# Patient Record
Sex: Female | Born: 1970 | Race: White | Hispanic: No | Smoking: Current every day smoker
Health system: Southern US, Community
[De-identification: ages and names within clinical notes are randomized; demographics above are authoritative.]

## PROBLEM LIST (undated history)

## (undated) ENCOUNTER — Emergency Department (HOSPITAL_BASED_OUTPATIENT_CLINIC_OR_DEPARTMENT_OTHER): Admission: EM | Payer: 59 | Source: Home / Self Care

## (undated) DIAGNOSIS — F319 Bipolar disorder, unspecified: Secondary | ICD-10-CM

## (undated) DIAGNOSIS — F419 Anxiety disorder, unspecified: Secondary | ICD-10-CM

## (undated) HISTORY — PX: DILATION AND CURETTAGE OF UTERUS: SHX78

## (undated) HISTORY — PX: OTHER SURGICAL HISTORY: SHX169

## (undated) HISTORY — PX: TUBAL LIGATION: SHX77

## (undated) HISTORY — PX: NO PAST SURGERIES: SHX2092

---

## 2008-09-05 ENCOUNTER — Ambulatory Visit: Payer: Self-pay | Admitting: Family Medicine

## 2008-09-05 DIAGNOSIS — J209 Acute bronchitis, unspecified: Secondary | ICD-10-CM

## 2009-01-06 ENCOUNTER — Ambulatory Visit: Payer: Self-pay | Admitting: Family Medicine

## 2009-01-06 DIAGNOSIS — J019 Acute sinusitis, unspecified: Secondary | ICD-10-CM | POA: Insufficient documentation

## 2009-01-06 DIAGNOSIS — F411 Generalized anxiety disorder: Secondary | ICD-10-CM | POA: Insufficient documentation

## 2009-05-13 ENCOUNTER — Ambulatory Visit: Payer: Self-pay | Admitting: Family Medicine

## 2009-05-13 DIAGNOSIS — S335XXA Sprain of ligaments of lumbar spine, initial encounter: Secondary | ICD-10-CM

## 2011-09-23 LAB — HM PAP SMEAR: HM PAP: NEGATIVE

## 2016-02-03 LAB — VITAMIN D 25 HYDROXY (VIT D DEFICIENCY, FRACTURES)
Vit D, 25-Hydroxy: 19
Vit D, 25-Hydroxy: 19

## 2016-02-03 LAB — BASIC METABOLIC PANEL
BUN: 11 mg/dL (ref 4–21)
Creatinine: 0.7 mg/dL (ref 0.5–1.1)
GLUCOSE: 87 mg/dL
POTASSIUM: 4.3 mmol/L (ref 3.4–5.3)
Sodium: 139 mmol/L (ref 137–147)

## 2016-02-03 LAB — LIPID PANEL
CHOLESTEROL: 184 mg/dL (ref 0–200)
HDL: 46 mg/dL (ref 35–70)
LDL Cholesterol: 128 mg/dL
TRIGLYCERIDES: 121 mg/dL (ref 40–160)

## 2016-02-03 LAB — HEPATIC FUNCTION PANEL
ALT: 1 U/L — AB (ref 7–35)
AST: 10 U/L — AB (ref 13–35)
Alkaline Phosphatase: 70 U/L (ref 25–125)

## 2016-02-03 LAB — TSH: TSH: 0.84 u[IU]/mL (ref 0.41–5.90)

## 2016-02-26 LAB — HM MAMMOGRAPHY

## 2016-10-13 ENCOUNTER — Ambulatory Visit (INDEPENDENT_AMBULATORY_CARE_PROVIDER_SITE_OTHER): Payer: Self-pay | Admitting: Family Medicine

## 2016-10-13 ENCOUNTER — Encounter: Payer: Self-pay | Admitting: Family Medicine

## 2016-10-13 VITALS — BP 124/64 | HR 101 | Ht 64.0 in | Wt 213.0 lb

## 2016-10-13 DIAGNOSIS — G43109 Migraine with aura, not intractable, without status migrainosus: Secondary | ICD-10-CM

## 2016-10-13 DIAGNOSIS — F430 Acute stress reaction: Secondary | ICD-10-CM

## 2016-10-13 DIAGNOSIS — F32A Depression, unspecified: Secondary | ICD-10-CM | POA: Insufficient documentation

## 2016-10-13 DIAGNOSIS — F329 Major depressive disorder, single episode, unspecified: Secondary | ICD-10-CM | POA: Insufficient documentation

## 2016-10-13 DIAGNOSIS — R030 Elevated blood-pressure reading, without diagnosis of hypertension: Secondary | ICD-10-CM

## 2016-10-13 DIAGNOSIS — F411 Generalized anxiety disorder: Secondary | ICD-10-CM

## 2016-10-13 NOTE — Progress Notes (Signed)
Subjective:    Patient ID: Hailey Wolf, female    DOB: January 25, 1971, 46 y.o.   MRN: [id number]  HPI 46 yo Female comes in today to follow-up for recent hospital visit and to establish with a primary care provider. She does not currently have a PCP. She actually went to the hospital, [facility] in [place] on March 8. At the time she was experiencing some facial numbness on the left side of her face as well as over the lips and tongue she had been experiencing a migraine for well over a day at that point. She says it felt almost like when you get anesthesia for dental work. She has been on Celexa for about 12 years. She was sent home with Fioricet and told to follow-up with neurology. She did actually go see the neurologist this morning. They felt like her symptoms were related to a complicated migraine. She is still expressing headaches on and off as well as some left facial numbness sensation. They decided to put her on amitriptyline at bedtime and give her Imitrex for rescue. She plans on picking up his medications later today. She did have an MRI/MRA of the brain that showed some minimal tiny white matter lesions. They were nonspecific.  She admits she's been under a lot of stress at work recently. She has worked in her job at [employer] for well over 20 years and 2 of her friends and her department who she has worked with for more than 10 years had a major disagreement and she is no longer friends with them. She feels like this may have affected her work ability. She said she was written up for being late to work for her work, even though she says she has never been late to work. She says she was also written for missing days but all of those were approved previously.. They requested that she go home and see a therapist through [facility]. She said she actually really like the therapist and so plans on continuing to see her weekly. The therapist acid actually recommended that she seek psychiatry to  possibly look at changing her Celexa. She also takes care of her son who has autism which is also another stress in her life. She admits she has not been sleeping well   Hospital and neurology notes reviewed.  Review of Systems  Constitutional: Negative for diaphoresis, fever and unexpected weight change.  HENT: Negative for hearing loss, postnasal drip, sneezing and tinnitus.   Eyes: Positive for visual disturbance.  Respiratory: Negative for cough and wheezing.   Cardiovascular: Negative for chest pain and palpitations.  Gastrointestinal: Positive for diarrhea, nausea and vomiting.  Genitourinary: Negative for vaginal bleeding and vaginal discharge.  Musculoskeletal: Negative for arthralgias.  Neurological: Positive for headaches.  Hematological: Negative for adenopathy. Does not bruise/bleed easily.  Psychiatric/Behavioral: Positive for dysphoric mood. The patient is nervous/anxious.      BP 124/64   Pulse (!) 101   Ht 5\' 4"  (1.626 m)   Wt 213 lb (96.6 kg)   SpO2 97%   BMI 36.56 kg/m     Allergies  Allergen Reactions  . Codeine Nausea And Vomiting    REACTION: GI Upset    History reviewed. No pertinent past medical history.   Past Surgical History:  Procedure Laterality Date  . NO PAST SURGERIES      Social History   Social History  . Marital status: Single    Spouse name: N/A  . Number of  children: 2  . 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   Social History Main Topics  . Smoking status: Current Every Day Smoker    Packs/day: 0.50    Types: Cigarettes  . Smokeless tobacco: Not on file  . Alcohol use 0.6 - 1.2 oz/week    1 - 2 Glasses of wine per week  . Drug use: No  . Sexual activity: Yes    Partners: Male   Other Topics Concern  . Not on file   Social History Narrative   2 caffeinated drinks per day. No regular exercise.    Family History  Problem Relation Age of Onset  . Hypertension Mother   .  Alcohol abuse Father   . Hypertension Father   . Diabetes Paternal Grandmother   . Alcohol abuse Paternal Aunt   . Ovarian cancer Paternal Aunt     Outpatient Encounter Prescriptions as of 10/13/2016  Medication Sig  . amitriptyline (ELAVIL) 25 MG tablet Take by mouth.  . citalopram (CELEXA) 40 MG tablet   . clonazePAM (KLONOPIN) 1 MG tablet take 1 tablet by mouth twice a day if needed for anxiety  . magnesium oxide (MAG-OX) 400 MG tablet Take by mouth.  . metoCLOPramide (REGLAN) 10 MG tablet Take by mouth.  . SUMAtriptan (IMITREX) 50 MG tablet Take by mouth.   No facility-administered encounter medications on file as of 10/13/2016.           Objective:   Physical Exam  Constitutional: She is oriented to person, place, and time. She appears well-developed and well-nourished.  HENT:  Head: Normocephalic and atraumatic.  Cardiovascular: Normal rate, regular rhythm and normal heart sounds.   Pulmonary/Chest: Effort normal and breath sounds normal.  Neurological: She is alert and oriented to person, place, and time.  Skin: Skin is warm and dry.  Psychiatric: She has a normal mood and affect. Her behavior is normal.       Assessment & Plan:  Elevated blood pressure-blood pressure looks absolutely fantastic today and she's never had any prior history of blood pressure issues. I think it was just related to the pain during her ED visit from the migraines.  Anxiety-has an appointment with her therapist again on Tuesday and is planning on seeing a psychiatrist for medication adjustment. Will need to complete FMLA work for her to be out of work for at least the next week.   Migraine headaches-she's been started on amitriptyline nightly and when necessary Imitrex. Encouraged her to work on sleep quality and work on stress reduction. We discussed how there are many triggers for migraines and getting that under control is very important.She is stopping the floor set as recommended by  neurology.

## 2016-10-18 ENCOUNTER — Telehealth: Payer: Self-pay | Admitting: *Deleted

## 2016-10-18 NOTE — Telephone Encounter (Signed)
FMLA forms completed, copied, scanned and faxed, confirmation received.Loralee PacasBarkley, Carden Teel Clark Mills[place]etta

## 2016-10-19 ENCOUNTER — Encounter: Payer: Self-pay | Admitting: Family Medicine

## 2016-10-30 ENCOUNTER — Ambulatory Visit: Payer: Self-pay | Admitting: Family Medicine

## 2016-10-31 ENCOUNTER — Encounter: Payer: Self-pay | Admitting: Family Medicine

## 2016-11-01 ENCOUNTER — Ambulatory Visit: Payer: Self-pay | Admitting: Family Medicine

## 2016-11-02 ENCOUNTER — Ambulatory Visit (INDEPENDENT_AMBULATORY_CARE_PROVIDER_SITE_OTHER): Payer: BLUE CROSS/BLUE SHIELD | Admitting: Family Medicine

## 2016-11-02 ENCOUNTER — Encounter: Payer: Self-pay | Admitting: Family Medicine

## 2016-11-02 ENCOUNTER — Telehealth: Payer: Self-pay | Admitting: Family Medicine

## 2016-11-02 VITALS — BP 128/74 | HR 77 | Ht 64.0 in | Wt 216.0 lb

## 2016-11-02 DIAGNOSIS — F411 Generalized anxiety disorder: Secondary | ICD-10-CM | POA: Diagnosis not present

## 2016-11-02 DIAGNOSIS — G43109 Migraine with aura, not intractable, without status migrainosus: Secondary | ICD-10-CM | POA: Diagnosis not present

## 2016-11-02 DIAGNOSIS — F321 Major depressive disorder, single episode, moderate: Secondary | ICD-10-CM

## 2016-11-02 MED ORDER — PAROXETINE HCL 20 MG PO TABS: 20.0000 mg | ORAL_TABLET | Freq: Every day | ORAL | 0 refills | Status: DC

## 2016-11-02 MED ORDER — HYDROXYZINE HCL 50 MG PO TABS: 25.0000 mg | ORAL_TABLET | Freq: Two times a day (BID) | ORAL | 3 refills | Status: DC | PRN

## 2016-11-02 NOTE — Progress Notes (Signed)
   Subjective:    Patient ID: Hailey Wolf, female    DOB: 10-Mar-1971, 46 y.o.   MRN: [id number]  HPI 10 day follow-up for anxiety/depression-unfortunately patient was expressing some serious stressors at work. To the point where was starting to affect her function at work. She started seeing a therapist through [facility] and came in to establish care. We did complete FMLA paperwork for her to have some time off to readjust. She is now on Celexa 40 mg. She was also expressing a lot of physical symptoms including increased frequency and intensity of headaches as well as elevated blood pressures. We had written out of work 3/8-3/26.  Unfortunately she says she was compelled to sign a forced resignation. She did go ahead and sign it. She is still seeing a therapist. She said she couldn't go last week because she felt so down but she has an appointment next week. She actually really likes her and feels like she's been very helpful. She does feel like her family's been very supportive. He says she's tried to start exercising over the last 2 weeks.  She was also seen neurology for complicated migraine. She just seen them the same day that I saw her for mood. They decided to put her on amitriptyline at bedtime and give her Imitrex for rescue. Had a negative MRI except for some white matter changes.  Review of Systems     Objective:   Physical Exam  Constitutional: She is oriented to person, place, and time. She appears well-developed and well-nourished.  HENT:  Head: Normocephalic and atraumatic.  Cardiovascular: Normal rate, regular rhythm and normal heart sounds.   Pulmonary/Chest: Effort normal and breath sounds normal.  Neurological: She is alert and oriented to person, place, and time.  Skin: Skin is warm and dry.  Psychiatric: She has a normal mood and affect. Her behavior is normal.       Assessment & Plan:  Anxiety/Depression-PHQ 9 score of 7 today and GAD-  7 score of 26.Discussed  options. She said she has tried fluoxetine in the past and it caused palpitations as well as Zoloft which also causes increased heart rate. She said she did take Paxil years ago and did well on it. We will go ahead and transition her to Paxil. I'll see her back in about 4-5 weeks. Keep up the exercise.  Migraine HA - ON imitrex, daily amitriptyline and magnesium supplementation.  At the end of the office visit she did mention that the forced resignation was somehow related to a urine drug screen that was performed and wondered if they had performed 1 during her recent ED visit. I was not aware of 1 and encouraged her to call their medical records department to see if they could provide her with information and a copy if they did perform 1.  Time spent 25 minutes, greater than 50% time spent counseling about anxiety depression and migraines.

## 2016-11-02 NOTE — Patient Instructions (Addendum)
Take half a tab the citalopram daily for 7 days. Then half a tab every other day for 6 days and then start the paxil.  Follow instructions on the bottle.

## 2016-11-02 NOTE — Telephone Encounter (Signed)
Pt's mother Gunnar Fusi) called to let PCP know she is concerned about her daughters current state. Reports Azrael "just doesn't care." States she will say mean things, cuss, and not care if it hurts anyone's feelings. Pt's mother reports "this is not like her." She just wanted to give PCP a heads up. Would like Pt to not know this phone call was made.

## 2016-12-01 ENCOUNTER — Encounter: Payer: Self-pay | Admitting: Family Medicine

## 2016-12-01 ENCOUNTER — Ambulatory Visit (INDEPENDENT_AMBULATORY_CARE_PROVIDER_SITE_OTHER): Payer: BLUE CROSS/BLUE SHIELD | Admitting: Family Medicine

## 2016-12-01 VITALS — BP 139/77 | HR 104 | Ht 64.0 in | Wt 213.0 lb

## 2016-12-01 DIAGNOSIS — F411 Generalized anxiety disorder: Secondary | ICD-10-CM | POA: Diagnosis not present

## 2016-12-01 DIAGNOSIS — F321 Major depressive disorder, single episode, moderate: Secondary | ICD-10-CM

## 2016-12-01 DIAGNOSIS — F0781 Postconcussional syndrome: Secondary | ICD-10-CM

## 2016-12-01 DIAGNOSIS — T7491XA Unspecified adult maltreatment, confirmed, initial encounter: Secondary | ICD-10-CM

## 2016-12-01 DIAGNOSIS — R319 Hematuria, unspecified: Secondary | ICD-10-CM

## 2016-12-01 LAB — POCT URINALYSIS DIPSTICK
BILIRUBIN UA: NEGATIVE
Glucose, UA: NEGATIVE
KETONES UA: NEGATIVE
Leukocytes, UA: NEGATIVE
Nitrite, UA: NEGATIVE
PH UA: 5.5 (ref 5.0–8.0)
PROTEIN UA: NEGATIVE
SPEC GRAV UA: 1.02 (ref 1.010–1.025)
Urobilinogen, UA: 0.2 E.U./dL

## 2016-12-01 NOTE — Patient Instructions (Addendum)
Keep your appointment with neurology next week. Recommend either ibuprofen or Aleve as needed. Can alternate with extra strength  Tylenol if needed. Don't drive until your Neurologist says it is OK.    Your symptoms including more being more forgetful, increased confusion, persistent headaches, and irritability are all related to your concussion. This can take several weeks or even months sometimes to completely resolved. The most important thing is to rest. Avoid overstimulation such as TV or lots of screen time. And avoid excessive activities. Some light activities around the house is okay. If you notice increase in nausea or start vomiting them please let us know immediately.   Post-Concussion Syndrome Post-concussion syndrome describes the symptoms that can occur after a head injury. These symptoms can last from weeks to months. What are the causes? It is not clear why some head injuries cause post-concussion syndrome. It can occur whether your head injury was mild or severe and whether you were wearing head protection or not. What are the signs or symptoms?  Memory difficulties.  Dizziness.  Headaches.  Double vision or blurry vision.  Sensitivity to light.  Hearing difficulties.  Depression.  Tiredness.  Weakness.  Difficulty with concentration.  Difficulty sleeping or staying asleep.  Vomiting.  Poor balance or instability on your feet.  Slow reaction time.  Difficulty learning and remembering things you have heard. How is this diagnosed? There is no test to determine whether you have post-concussion syndrome. Your health care provider may order an imaging scan of your brain, such as a CT scan, to check for other problems that may be causing your symptoms (such as a severe injury inside your skull). How is this treated? Usually, these problems disappear over time without medical care. Your health care provider may prescribe medicine to help ease your symptoms. It  is important to follow up with a neurologist to evaluate your recovery and address any lingering symptoms or issues. Follow these instructions at home:  Take medicines only as directed by your health care provider. Do not take aspirin. Aspirin can slow blood clotting.  Sleep with your head slightly elevated to help with headaches.  Avoid any situation where there is potential for another head injury. This includes football, hockey, soccer, basketball, martial arts, downhill snow sports, and horseback riding. Your condition will get worse every time you experience a concussion. You should avoid these activities until you are evaluated by the appropriate follow-up health care providers.  Keep all follow-up visits as directed by your health care provider. This is important. Contact a health care provider if:  You have increased problems paying attention or concentrating.  You have increased difficulty remembering or learning new information.  You need more time to complete tasks or assignments than before.  You have increased irritability or decreased ability to cope with stress.  You have more symptoms than before. Seek medical care if you have any of the following symptoms for more than two weeks after your injury:  Lasting (chronic) headaches.  Dizziness or balance problems.  Nausea.  Vision problems.  Increased sensitivity to noise or light.  Depression or mood swings.  Anxiety or irritability.  Memory problems.  Difficulty concentrating or paying attention.  Sleep problems.  Feeling tired all the time. Get help right away if:  You have confusion or unusual drowsiness.  Others find it difficult to wake you up.  You have nausea or persistent, forceful vomiting.  You feel like you are moving when you are not (vertigo). Your  eyes may move rapidly back and forth.  You have convulsions or faint.  You have severe, persistent headaches that are not relieved by  medicine.  You cannot use your arms or legs normally.  One of your pupils is larger than the other.  You have clear or bloody discharge from your nose or ears.  Your problems are getting worse, not better. This information is not intended to replace advice given to you by your health care provider. Make sure you discuss any questions you have with your health care provider. Document Released: 01/06/2002 Document Revised: 02/04/2016 Document Reviewed: 10/22/2013 Elsevier Interactive Patient Education  2017 [employer].
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
## 2016-12-02 LAB — URINALYSIS, MICROSCOPIC ONLY
BACTERIA UA: NONE SEEN [HPF]
CASTS: NONE SEEN [LPF]
Squamous Epithelial / HPF: NONE SEEN [HPF] (ref <=5)
WBC, UA: NONE SEEN WBC/HPF (ref <=5)
YEAST: NONE SEEN [HPF]

## 2016-12-03 LAB — URINE CULTURE

## 2016-12-18 ENCOUNTER — Ambulatory Visit: Payer: BLUE CROSS/BLUE SHIELD | Admitting: Family Medicine

## 2017-05-13 ENCOUNTER — Encounter (HOSPITAL_COMMUNITY): Payer: Self-pay | Admitting: *Deleted

## 2017-05-13 ENCOUNTER — Emergency Department (HOSPITAL_COMMUNITY): Payer: Medicaid Other

## 2017-05-13 ENCOUNTER — Emergency Department (HOSPITAL_COMMUNITY)
Admission: EM | Admit: 2017-05-13 | Discharge: 2017-05-14 | Disposition: A | Discharge status: Other Acute Inpatient Hospital | Payer: Medicaid Other | Attending: Emergency Medicine | Admitting: Emergency Medicine

## 2017-05-13 ENCOUNTER — Ambulatory Visit (HOSPITAL_COMMUNITY)
Admission: RE | Admit: 2017-05-13 | Discharge: 2017-05-13 | Disposition: A | Discharge status: Home / Self Care | Payer: Medicaid Other | Attending: Psychiatry | Admitting: Psychiatry

## 2017-05-13 DIAGNOSIS — F17210 Nicotine dependence, cigarettes, uncomplicated: Secondary | ICD-10-CM | POA: Insufficient documentation

## 2017-05-13 DIAGNOSIS — R45851 Suicidal ideations: Secondary | ICD-10-CM | POA: Diagnosis not present

## 2017-05-13 DIAGNOSIS — F329 Major depressive disorder, single episode, unspecified: Secondary | ICD-10-CM | POA: Insufficient documentation

## 2017-05-13 DIAGNOSIS — Z79899 Other long term (current) drug therapy: Secondary | ICD-10-CM | POA: Insufficient documentation

## 2017-05-13 DIAGNOSIS — F419 Anxiety disorder, unspecified: Secondary | ICD-10-CM | POA: Diagnosis not present

## 2017-05-13 HISTORY — DX: Anxiety disorder, unspecified: F41.9

## 2017-05-13 LAB — RAPID URINE DRUG SCREEN, HOSP PERFORMED
Amphetamines: NOT DETECTED
BARBITURATES: NOT DETECTED
Benzodiazepines: POSITIVE — AB
COCAINE: NOT DETECTED
Opiates: NOT DETECTED
TETRAHYDROCANNABINOL: NOT DETECTED

## 2017-05-13 LAB — CBC
HEMATOCRIT: 37.7 % (ref 36.0–46.0)
HEMOGLOBIN: 13 g/dL (ref 12.0–15.0)
MCH: 26.5 pg (ref 26.0–34.0)
MCHC: 34.5 g/dL (ref 30.0–36.0)
MCV: 76.9 fL — ABNORMAL LOW (ref 78.0–100.0)
Platelets: 241 10*3/uL (ref 150–400)
RBC: 4.9 MIL/uL (ref 3.87–5.11)
RDW: 13.9 % (ref 11.5–15.5)
WBC: 7.3 10*3/uL (ref 4.0–10.5)

## 2017-05-13 LAB — COMPREHENSIVE METABOLIC PANEL
ALBUMIN: 4 g/dL (ref 3.5–5.0)
ALK PHOS: 69 U/L (ref 38–126)
ALT: 8 U/L — AB (ref 14–54)
AST: 11 U/L — ABNORMAL LOW (ref 15–41)
Anion gap: 8 (ref 5–15)
BUN: 9 mg/dL (ref 6–20)
CALCIUM: 9.5 mg/dL (ref 8.9–10.3)
CO2: 25 mmol/L (ref 22–32)
CREATININE: 0.69 mg/dL (ref 0.44–1.00)
Chloride: 102 mmol/L (ref 101–111)
GFR calc Af Amer: >60 mL/min (ref >60)
GFR calc non Af Amer: >60 mL/min (ref >60)
GLUCOSE: 88 mg/dL (ref 65–99)
Potassium: 4 mmol/L (ref 3.5–5.1)
SODIUM: 135 mmol/L (ref 135–145)
Total Bilirubin: 0.7 mg/dL (ref 0.3–1.2)
Total Protein: 7.3 g/dL (ref 6.5–8.1)

## 2017-05-13 LAB — I-STAT TROPONIN, ED: Troponin i, poc: 0 ng/mL (ref 0.00–0.08)

## 2017-05-13 LAB — I-STAT BETA HCG BLOOD, ED (MC, WL, AP ONLY)

## 2017-05-13 LAB — ETHANOL: Alcohol, Ethyl (B): <10 mg/dL (ref <10)

## 2017-05-13 LAB — SALICYLATE LEVEL: Salicylate Lvl: <7 mg/dL (ref 2.8–30.0)

## 2017-05-13 LAB — ACETAMINOPHEN LEVEL

## 2017-05-13 MED ORDER — ESCITALOPRAM OXALATE 10 MG PO TABS: 10.0000 mg | ORAL_TABLET | Freq: Every day | ORAL | Status: DC
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
## 2017-05-13 NOTE — H&P (Signed)
Behavioral Health Medical Screening Exam  Hailey Wolf is an 46 y.o. female.  Total Time spent with patient: 30 minutes  Psychiatric Specialty Exam: Physical Exam  Vitals reviewed. Constitutional: She is oriented to person, place, and time. She appears well-developed and well-nourished.  HENT:  Head: Normocephalic and atraumatic.  Eyes: Pupils are equal, round, and reactive to light.  Neck: Normal range of motion.  Cardiovascular: Normal rate, regular rhythm and normal heart sounds.   Respiratory: Effort normal and breath sounds normal.  GI: Soft. Bowel sounds are normal.  Musculoskeletal: Normal range of motion.  Neurological: She is alert and oriented to person, place, and time.  Skin: Skin is warm and dry.    Review of Systems  Psychiatric/Behavioral: Positive for depression and suicidal ideas (passive). Negative for hallucinations, memory loss and substance abuse. The patient is nervous/anxious and has insomnia.   All other systems reviewed and are negative.   Blood pressure 120/84, pulse 73, temperature 98.6 F (37 C), temperature source Oral, resp. rate 16, SpO2 97 %.There is no height or weight on file to calculate BMI.  General Appearance: Casual  Eye Contact:  Good  Speech:  Clear and Coherent and Normal Rate  Volume:  Normal  Mood:  Anxious and Depressed  Affect:  Congruent, Depressed and Tearful  Thought Process:  Coherent and Goal Directed  Orientation:  Full (Time, Place, and Person)  Thought Content:  WDL and Logical  Suicidal Thoughts:  passive ideation of wishing she's better off dead  Homicidal Thoughts:  No  Memory:  Immediate;   Good Recent;   Good Remote;   Fair  Judgement:  Intact  Insight:  Present  Psychomotor Activity:  Normal  Concentration: Concentration: Good and Attention Span: Good  Recall:  Good  Fund of Knowledge:Good  Language: Good  Akathisia:  Negative  Handed:  Right  AIMS (if indicated):     Assets:  Communication  Skills Desire for Improvement Resilience Social Support  Sleep:       Musculoskeletal: Strength & Muscle Tone: within normal limits Gait & Station: normal Patient leans: N/A  Blood pressure 120/84, pulse 73, temperature 98.6 F (37 C), temperature source Oral, resp. rate 16, SpO2 97 %.  Recommendations:  Based on my evaluation the patient appears to have an emergency medical condition for which I recommend the patient be transferred to the emergency department for further evaluation.  Delila Pereyra, NP 05/13/2017, 4:23 PM

## 2017-05-13 NOTE — ED Triage Notes (Signed)
Pt reports she was sent here from [facility] for medical clearance.  Pt reports severe anxiety x 2 weeks with SI, but denies plan.  Pt reports her PCP placed on Lexapro  without relief.  Pt reports she is feeling overwhelmed and is making her anxiety worse.  Pt is calm and cooperative.  She called mobil crisis and was advised to go to [facility].  Pt reports cp which she states happens every time she has an anxiety attack.

## 2017-05-13 NOTE — ED Notes (Signed)
Patient transported to X-ray 
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
## 2017-05-13 NOTE — BH Assessment (Signed)
[facility] Assessment Progress Note  Pt's referral has been faxed to the following inpt facilities for possible admission:  [facility], [place], [place], [address] [place], [place], [place] [facility], [place].  Princess Bruins, MSW, LCSW Therapeutic Triage Specialist  [phone]

## 2017-05-14 MED ORDER — IBUPROFEN 800 MG PO TABS
800.0000 mg | ORAL_TABLET | Freq: Once | ORAL | Status: AC
  Administered 2017-05-14: 800 mg via ORAL
  Filled 2017-05-14: qty 1

## 2017-05-14 NOTE — Progress Notes (Signed)
TTS received a call from [place] at [facility] who reports the pt has been accepted for admission. Accepting is Dr. [place] Spellman, MD. Pt to arrive after 07:00 on 05/14/17. Call to report [phone]. Pt's nurse Trisha Mangle, RN notified of acceptance.   Princess Bruins, MSW, LCSW Therapeutic Triage Specialist  [phone]

## 2017-05-14 NOTE — ED Notes (Signed)
Pt. On paper scrubs, wanded by security ,personal belongings kept in locker,labeled .sitetr at bedside.

## 2017-05-31 ENCOUNTER — Ambulatory Visit (INDEPENDENT_AMBULATORY_CARE_PROVIDER_SITE_OTHER): Payer: Medicaid Other | Admitting: Psychiatry

## 2017-05-31 ENCOUNTER — Encounter (HOSPITAL_COMMUNITY): Payer: Self-pay | Admitting: Psychiatry

## 2017-05-31 VITALS — BP 126/80 | HR 71 | Resp 16 | Ht 63.2 in | Wt 166.0 lb

## 2017-05-31 DIAGNOSIS — F332 Major depressive disorder, recurrent severe without psychotic features: Secondary | ICD-10-CM | POA: Diagnosis not present

## 2017-05-31 DIAGNOSIS — Z818 Family history of other mental and behavioral disorders: Secondary | ICD-10-CM | POA: Diagnosis not present

## 2017-05-31 DIAGNOSIS — Z811 Family history of alcohol abuse and dependence: Secondary | ICD-10-CM

## 2017-05-31 DIAGNOSIS — F411 Generalized anxiety disorder: Secondary | ICD-10-CM

## 2017-05-31 DIAGNOSIS — F331 Major depressive disorder, recurrent, moderate: Secondary | ICD-10-CM

## 2017-05-31 DIAGNOSIS — F17210 Nicotine dependence, cigarettes, uncomplicated: Secondary | ICD-10-CM | POA: Diagnosis not present

## 2017-05-31 DIAGNOSIS — G4700 Insomnia, unspecified: Secondary | ICD-10-CM

## 2017-05-31 DIAGNOSIS — Z79899 Other long term (current) drug therapy: Secondary | ICD-10-CM | POA: Diagnosis not present

## 2017-05-31 MED ORDER — TRAZODONE HCL 100 MG PO TABS: 200.0000 mg | ORAL_TABLET | Freq: Every day | ORAL | 0 refills | Status: DC

## 2017-05-31 MED ORDER — ESCITALOPRAM OXALATE 20 MG PO TABS: 30.0000 mg | ORAL_TABLET | Freq: Every day | ORAL | 0 refills | Status: DC

## 2017-05-31 MED ORDER — BUSPIRONE HCL 7.5 MG PO TABS: 7.5000 mg | ORAL_TABLET | Freq: Every day | ORAL | 0 refills | Status: DC | PRN
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
## 2017-06-07 ENCOUNTER — Ambulatory Visit (HOSPITAL_COMMUNITY): Payer: BLUE CROSS/BLUE SHIELD | Admitting: Psychiatry

## 2017-06-14 ENCOUNTER — Ambulatory Visit (INDEPENDENT_AMBULATORY_CARE_PROVIDER_SITE_OTHER): Payer: Medicaid Other | Admitting: Licensed Clinical Social Worker

## 2017-06-14 DIAGNOSIS — F411 Generalized anxiety disorder: Secondary | ICD-10-CM

## 2017-06-14 DIAGNOSIS — F331 Major depressive disorder, recurrent, moderate: Secondary | ICD-10-CM
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
## 2017-06-18 ENCOUNTER — Ambulatory Visit (INDEPENDENT_AMBULATORY_CARE_PROVIDER_SITE_OTHER): Payer: Medicaid Other | Admitting: Psychiatry

## 2017-06-18 ENCOUNTER — Other Ambulatory Visit: Payer: Self-pay

## 2017-06-18 ENCOUNTER — Encounter (HOSPITAL_COMMUNITY): Payer: Self-pay | Admitting: Psychiatry

## 2017-06-18 ENCOUNTER — Ambulatory Visit (HOSPITAL_COMMUNITY): Payer: Self-pay | Admitting: Psychiatry

## 2017-06-18 VITALS — BP 118/70 | HR 76 | Ht 62.25 in | Wt 170.0 lb

## 2017-06-18 DIAGNOSIS — F411 Generalized anxiety disorder: Secondary | ICD-10-CM | POA: Diagnosis not present

## 2017-06-18 DIAGNOSIS — F331 Major depressive disorder, recurrent, moderate: Secondary | ICD-10-CM | POA: Diagnosis not present

## 2017-06-18 MED ORDER — ESCITALOPRAM OXALATE 20 MG PO TABS: 30.0000 mg | ORAL_TABLET | Freq: Every day | ORAL | 1 refills | Status: DC

## 2017-06-18 MED ORDER — TRAZODONE HCL 100 MG PO TABS: 100.0000 mg | ORAL_TABLET | Freq: Every day | ORAL | 1 refills | Status: DC

## 2017-06-18 MED ORDER — BUSPIRONE HCL 7.5 MG PO TABS: 7.5000 mg | ORAL_TABLET | Freq: Every day | ORAL | 1 refills | Status: DC | PRN
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
## 2017-07-10 ENCOUNTER — Ambulatory Visit (HOSPITAL_COMMUNITY): Payer: Self-pay | Admitting: Licensed Clinical Social Worker

## 2017-07-18 ENCOUNTER — Ambulatory Visit (INDEPENDENT_AMBULATORY_CARE_PROVIDER_SITE_OTHER): Payer: Medicaid Other | Admitting: Licensed Clinical Social Worker

## 2017-07-18 DIAGNOSIS — F331 Major depressive disorder, recurrent, moderate: Secondary | ICD-10-CM | POA: Diagnosis not present

## 2017-07-18 DIAGNOSIS — F411 Generalized anxiety disorder: Secondary | ICD-10-CM
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
## 2017-08-06 ENCOUNTER — Ambulatory Visit (HOSPITAL_COMMUNITY): Payer: Self-pay | Admitting: Psychiatry

## 2017-08-16 ENCOUNTER — Encounter (HOSPITAL_COMMUNITY): Payer: Self-pay | Admitting: Psychiatry

## 2017-08-16 ENCOUNTER — Ambulatory Visit (INDEPENDENT_AMBULATORY_CARE_PROVIDER_SITE_OTHER): Payer: Medicaid Other | Admitting: Psychiatry

## 2017-08-16 ENCOUNTER — Other Ambulatory Visit: Payer: Self-pay

## 2017-08-16 VITALS — BP 110/60 | HR 78 | Ht 64.0 in | Wt 179.0 lb

## 2017-08-16 DIAGNOSIS — F411 Generalized anxiety disorder: Secondary | ICD-10-CM | POA: Diagnosis not present

## 2017-08-16 DIAGNOSIS — F331 Major depressive disorder, recurrent, moderate: Secondary | ICD-10-CM

## 2017-08-16 DIAGNOSIS — Z818 Family history of other mental and behavioral disorders: Secondary | ICD-10-CM | POA: Diagnosis not present

## 2017-08-16 DIAGNOSIS — G4700 Insomnia, unspecified: Secondary | ICD-10-CM

## 2017-08-16 DIAGNOSIS — Z79899 Other long term (current) drug therapy: Secondary | ICD-10-CM

## 2017-08-16 DIAGNOSIS — Z811 Family history of alcohol abuse and dependence: Secondary | ICD-10-CM | POA: Diagnosis not present

## 2017-08-16 DIAGNOSIS — F332 Major depressive disorder, recurrent severe without psychotic features: Secondary | ICD-10-CM

## 2017-08-16 DIAGNOSIS — Z87891 Personal history of nicotine dependence: Secondary | ICD-10-CM | POA: Diagnosis not present

## 2017-08-16 MED ORDER — BUSPIRONE HCL 7.5 MG PO TABS: 7.5000 mg | ORAL_TABLET | Freq: Two times a day (BID) | ORAL | 1 refills | Status: DC

## 2017-08-16 MED ORDER — TRAZODONE HCL 100 MG PO TABS: 100.0000 mg | ORAL_TABLET | Freq: Every day | ORAL | 1 refills | Status: DC

## 2017-08-16 MED ORDER — ESCITALOPRAM OXALATE 20 MG PO TABS: 30.0000 mg | ORAL_TABLET | Freq: Every day | ORAL | 1 refills | Status: DC
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
## 2017-08-24 ENCOUNTER — Ambulatory Visit (HOSPITAL_COMMUNITY): Payer: Self-pay | Admitting: Licensed Clinical Social Worker

## 2017-08-29 ENCOUNTER — Ambulatory Visit (INDEPENDENT_AMBULATORY_CARE_PROVIDER_SITE_OTHER): Payer: Medicaid Other | Admitting: Licensed Clinical Social Worker

## 2017-08-29 DIAGNOSIS — F411 Generalized anxiety disorder: Secondary | ICD-10-CM | POA: Diagnosis not present

## 2017-08-29 DIAGNOSIS — F331 Major depressive disorder, recurrent, moderate: Secondary | ICD-10-CM
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
## 2017-09-24 ENCOUNTER — Encounter (HOSPITAL_COMMUNITY): Payer: Self-pay | Admitting: Psychiatry

## 2017-09-24 ENCOUNTER — Other Ambulatory Visit: Payer: Self-pay

## 2017-09-24 ENCOUNTER — Ambulatory Visit (INDEPENDENT_AMBULATORY_CARE_PROVIDER_SITE_OTHER): Payer: Medicaid Other | Admitting: Psychiatry

## 2017-09-24 VITALS — BP 110/86 | HR 90 | Ht 64.0 in | Wt 175.0 lb

## 2017-09-24 DIAGNOSIS — F411 Generalized anxiety disorder: Secondary | ICD-10-CM | POA: Diagnosis not present

## 2017-09-24 DIAGNOSIS — Z811 Family history of alcohol abuse and dependence: Secondary | ICD-10-CM | POA: Diagnosis not present

## 2017-09-24 DIAGNOSIS — F17211 Nicotine dependence, cigarettes, in remission: Secondary | ICD-10-CM

## 2017-09-24 DIAGNOSIS — Z79899 Other long term (current) drug therapy: Secondary | ICD-10-CM | POA: Diagnosis not present

## 2017-09-24 DIAGNOSIS — F331 Major depressive disorder, recurrent, moderate: Secondary | ICD-10-CM | POA: Diagnosis not present

## 2017-09-24 MED ORDER — CITALOPRAM HYDROBROMIDE 40 MG PO TABS: 40.0000 mg | ORAL_TABLET | Freq: Every day | ORAL | 0 refills | Status: DC

## 2017-09-24 MED ORDER — TRAZODONE HCL 100 MG PO TABS: 100.0000 mg | ORAL_TABLET | Freq: Every day | ORAL | 1 refills | Status: DC

## 2017-09-24 MED ORDER — HYDROXYZINE HCL 10 MG PO TABS: 10.0000 mg | ORAL_TABLET | Freq: Three times a day (TID) | ORAL | 0 refills | Status: DC | PRN

## 2017-09-24 NOTE — Progress Notes (Signed)
[facility]   Patient Identification: Hailey Wolf MRN:  [id number] Date of Evaluation:  09/24/2017 Referral Source: Primary care Konrad PentaShuanna Garret  Chief Complaint:   Chief Complaint    Follow-up; Other     Visit Diagnosis:    ICD-10-CM   1. Moderate episode of recurrent major depressive disorder (HCC) F33.1   2. GAD (generalized anxiety disorder) F41.1     History of Present Illness:  47 years old divorced female, living with mom.  Recently admitted to hospital in [place] due to severe depression and suicidal thougths  Is here with mom. Feeling subdued. Failure. Kids under custody of her mom Dad is sick which is adding to her stress  Says celexa has helped before.   Feels buspar not helping anxiety  Aggravating factor: finances, lost job before  No psychosis, but feels people talk about he rwhen depressed Modifying factors: mom, kids  anxiety ongoing No psychosis   Past Psychiatric History: depression  Previous Psychotropic Medications: Yes   Substance Abuse History in the last 12 months:  No.  Consequences of Substance Abuse: NA  Past Medical History:  Past Medical History:  Diagnosis Date  . Anxiety     Past Surgical History:  Procedure Laterality Date  . CESAREAN SECTION    . DILATION AND CURETTAGE OF UTERUS    . lumpectormy    . NO PAST SURGERIES    . TUBAL LIGATION      Family Psychiatric History: mom and dad: depression  Family History:  Family History  Problem Relation Age of Onset  . Hypertension Mother   . Alcohol abuse Father   . Hypertension Father   . Diabetes Paternal Grandmother   . Alcohol abuse Paternal Aunt   . Ovarian cancer Paternal Aunt     Social History:   Social History   Socioeconomic History  . Marital status: Divorced    Spouse name: None  . Number of children: 2  . Years of education: assoc  . Highest education level: None  Social Needs  . Financial resource strain: None  . Food  insecurity - worry: None  . Food insecurity - inability: None  . Transportation needs - medical: None  . Transportation needs - non-medical: None  Occupational History  . Occupation: Diplomatic Services operational officerecretary     Comment: [employer]  Tobacco Use  . Smoking status: Former Smoker    Packs/day: 0.50    Types: Cigarettes    Last attempt to quit: 05/19/2017    Years since quitting: 0.3  . Smokeless tobacco: Never Used  . Tobacco comment: last cig 05/11/17  Substance and Sexual Activity  . Alcohol use: Yes    Alcohol/week: 0.6 - 1.2 oz    Types: 1 - 2 Glasses of wine per week    Comment: rare   . Drug use: No  . Sexual activity: Yes    Partners: Male  Other Topics Concern  . None  Social History Narrative   2 caffeinated drinks per day. No regular exercise.Lives with her fiance.       Allergies:   Allergies  Allergen Reactions  . Codeine Nausea And Vomiting    REACTION: GI Upset  . Fluoxetine Other (See Comments)    Palpitations  . Zoloft [Sertraline Hcl] Other (See Comments)    Palpitations    Metabolic Disorder Labs: No results found for: HGBA1C, MPG No results found for: PROLACTIN Lab Results  Component Value Date   CHOL 184 02/03/2016  TRIG 121 02/03/2016   HDL 46 02/03/2016   LDLCALC 128 02/03/2016     Current Medications: Current Outpatient Medications  Medication Sig Dispense Refill  . busPIRone (BUSPAR) 7.5 MG tablet Take 1 tablet (7.5 mg total) by mouth 2 (two) times daily. 60 tablet 1  . traZODone (DESYREL) 100 MG tablet Take 1 tablet (100 mg total) by mouth at bedtime. 30 tablet 1  . citalopram (CELEXA) 40 MG tablet Take 1 tablet (40 mg total) by mouth daily. 30 tablet 0  . hydrOXYzine (ATARAX/VISTARIL) 10 MG tablet Take 1 tablet (10 mg total) by mouth 3 (three) times daily as needed. 30 tablet 0   No current facility-administered medications for this visit.       Psychiatric Specialty Exam: Review of Systems  Cardiovascular: Negative for palpitations.   Skin: Negative for rash.  Neurological: Negative for tremors.  Psychiatric/Behavioral: Positive for depression. Negative for substance abuse. The patient is nervous/anxious.     Blood pressure 110/86, pulse 90, height 5\' 4"  (1.626 m), weight 175 lb (79.4 kg).Body mass index is 30.04 kg/m.  General Appearance: Casual  Eye Contact:  Fair  Speech:  Slow  Volume:  Decreased  Mood:  Stressed and dysphoric  Affect:  constricted  Thought Process:  Goal Directed  Orientation:  Full (Time, Place, and Person)  Thought Content:  Rumination  Suicidal Thoughts:  No  Homicidal Thoughts:  No  Memory:  Immediate;   Fair Recent;   Fair  Judgement:  Poor  Insight:  Shallow  Psychomotor Activity:  Decreased  Concentration:  Concentration: Fair and Attention Span: Fair  Recall:  Poor  Fund of Knowledge:Fair  Language: Fair  Akathisia:  Negative  Handed:  Right  AIMS (if indicated):  0  Assets:  Desire for Improvement  ADL's:  Intact  Cognition: WNL  Sleep:  variable    Treatment Plan Summaras followsMedication management and Plan as follows   1. Major depression severe, recurrent: dysphoric, stop lexapro, start celexa 40mg .    2. GAD: remains stressed. Work in therapy. Start celexa. Add vistaril 10mg  bid prn  stop buspar  3. Insomnia: irregular sleep. Continue trazadone. Work on sleep hygiene   Fu 6 w. Mom agrees to plan. Assign a Worry time and distract from negative thoughs after that time daily  Thresa Ross, MD 2/25/201910:53 AM

## 2017-09-26 ENCOUNTER — Ambulatory Visit (INDEPENDENT_AMBULATORY_CARE_PROVIDER_SITE_OTHER): Payer: Medicaid Other | Admitting: Licensed Clinical Social Worker

## 2017-09-26 DIAGNOSIS — F411 Generalized anxiety disorder: Secondary | ICD-10-CM

## 2017-09-26 DIAGNOSIS — F331 Major depressive disorder, recurrent, moderate: Secondary | ICD-10-CM | POA: Diagnosis not present

## 2017-09-26 NOTE — Progress Notes (Signed)
THERAPIST PROGRESS NOTE  Session Time: 11:06 AM to 12:02 PM  Participation Level: Active  Behavioral Response: CasualAlertDysphoric, tearful at times in session  Type of Therapy: Individual Therapy  Treatment Goals addressed:  reduce overall level, frequency and intensity of the anxiety so that daily functioning is not impaired, work on coping skills, increase in self-esteem and decrease of trauma symptoms  Interventions: CBT, Solution Focused, Strength-based, Supportive and Reframing  Summary: Hailey Wolf is a 47 y.o. female who presents with emotionally and physically exhausted after spending a couple of hours with her dad. Right now she is taking care of him and she is the only one to do that as she is the only child. Describes lots of anxiety taking care of him, explored source of anxiety and patient unsure, possibly, being at house. She hasn't stayed at his house too much over the years. Therapist suggested that perhaps she has not recovered from dealing with her own issues and difficult now to step in and help dad. Patient is trying to cut back time with him to help her cope. Shares in general the family has dealt with a lot of trauma in the past 5 years, lots of losses in the family. Shared more about her experiences with dad, that she gets sucked down, he talks a lot, talks a lot about his health issues. He also talks about her getting a job and he does not understand the difficulties of that. Updated therapist on medications, and that doctor has prescribed a med to deal with situational anxiety that she also has to figure out ways to calm down. Patient describes anxiety all day, has panic attacks and describes that she is going to pass out, feels like going to vomit, tries to talk herself down, describes the hyperarousal state is something she feels all day, describes it as physically exhausting. Describes feelings of guilt related to how she feels taking care of dad, thinks she  doesn't know right to feel the way she does. Therapist challenged patient on being judgmental to self, to recognize feelings as her feeling so valid. Patient describes depression, does not feel hopeful about the future, and use reframing to help patient look at future from different perspective. Describes everything is exhausting, going from one crisis to another, lots of stressors, worrying all the time, about family. Does feel that she is in a safe place at mom's, followed up with neurologist and there was no problems indicated. Reviewed session and patient related most helpful was to get things out and has somebody to talk to about it.  .   Suicidal/Homicidal: No  Therapist Response: Assess patient current functioning per report. Help patient to process feelings as labeling in discussing help with coping as well as discussing stressors and cope with stressors. Encourage positive self talk that will help with stressor of helping her dad right now as well as encouraging her to take a bigger picture to help remind herself that being there for him will be something she'll feel good about herself. Work with patient on cognitive distortions of anxiety, that it involves predictions about the future that have not occurred, so worries about things that have not happened, encouraged patient with more helpful strategies such as problem solving. Work with patient on broader perspective in helping her manage her depression by helping her see that her life is not defined by this moment, that she's had positive experiences and what would prevent that from happening for her in the future.  Encouraged patient to engage in small activities that will help her improve mood. Challenged patient on cognitive distortions of fortune telling, black-and-white thinking, emotional reasoning help challenge her on depressive symptoms. Engaged in problem solving to help patient in addressing stressors. Reinforce patient's insight on origins  of panic is Art therapist. Provided supportive and strength-based intervention  Plan: Return again in 1 week.2. Provided patient with resource a woman's empowerment group here in [place].3. Therapist continued to work with patient on stress management strategies, CBT interventions, mood regulation strategies  Diagnosis: Axis I:  moderate episode of recurrent major depressive disorder, generalized anxiety disorder    Axis II: No diagnosis    Coolidge Breeze, LCSW 09/26/2017

## 2017-10-01 ENCOUNTER — Ambulatory Visit (HOSPITAL_COMMUNITY): Payer: Self-pay | Admitting: Licensed Clinical Social Worker

## 2017-10-10 ENCOUNTER — Ambulatory Visit (INDEPENDENT_AMBULATORY_CARE_PROVIDER_SITE_OTHER): Payer: Medicaid Other | Admitting: Licensed Clinical Social Worker

## 2017-10-10 DIAGNOSIS — F331 Major depressive disorder, recurrent, moderate: Secondary | ICD-10-CM | POA: Diagnosis not present

## 2017-10-10 DIAGNOSIS — F411 Generalized anxiety disorder: Secondary | ICD-10-CM | POA: Diagnosis not present
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
## 2017-10-15 ENCOUNTER — Ambulatory Visit (HOSPITAL_COMMUNITY): Payer: Self-pay | Admitting: Psychiatry

## 2017-10-23 ENCOUNTER — Encounter (HOSPITAL_COMMUNITY): Payer: Self-pay | Admitting: Psychiatry

## 2017-10-23 ENCOUNTER — Ambulatory Visit (INDEPENDENT_AMBULATORY_CARE_PROVIDER_SITE_OTHER): Payer: Medicaid Other | Admitting: Psychiatry

## 2017-10-23 VITALS — Ht 64.0 in

## 2017-10-23 DIAGNOSIS — F331 Major depressive disorder, recurrent, moderate: Secondary | ICD-10-CM

## 2017-10-23 DIAGNOSIS — R4581 Low self-esteem: Secondary | ICD-10-CM | POA: Diagnosis not present

## 2017-10-23 DIAGNOSIS — F411 Generalized anxiety disorder: Secondary | ICD-10-CM | POA: Diagnosis not present

## 2017-10-23 DIAGNOSIS — Z818 Family history of other mental and behavioral disorders: Secondary | ICD-10-CM | POA: Diagnosis not present

## 2017-10-23 DIAGNOSIS — R450 Nervousness: Secondary | ICD-10-CM

## 2017-10-23 DIAGNOSIS — Z811 Family history of alcohol abuse and dependence: Secondary | ICD-10-CM | POA: Diagnosis not present

## 2017-10-23 DIAGNOSIS — Z87891 Personal history of nicotine dependence: Secondary | ICD-10-CM

## 2017-10-23 MED ORDER — BUSPIRONE HCL 7.5 MG PO TABS: 7.5000 mg | ORAL_TABLET | Freq: Two times a day (BID) | ORAL | 1 refills | Status: DC

## 2017-10-23 MED ORDER — CITALOPRAM HYDROBROMIDE 40 MG PO TABS: 40.0000 mg | ORAL_TABLET | Freq: Every day | ORAL | 0 refills | Status: DC

## 2017-10-23 NOTE — Progress Notes (Signed)
[facility]   Patient Identification: Hailey HeapChristy M Wolf MRN:  [id number] Date of Evaluation:  10/23/2017 Referral Source: Primary care Konrad PentaShuanna Garret  Chief Complaint:   Chief Complaint    Follow-up; Other     Visit Diagnosis:    ICD-10-CM   1. Moderate episode of recurrent major depressive disorder (HCC) F33.1   2. GAD (generalized anxiety disorder) F41.1     History of Present Illness:  47 years old divorced female, living with mom.  Recently admitted to hospital in [place] due to severe depression and suicidal thougths  Feels anxious. Apparently not taking buspar bid External stressors being dad is sick . No job  Feels low esteem. Has started therapy  Grandfather sick Aggravating factor: finances, lost job before  No psychosis,  Modifying factors: mom, kids  anxiety ongoing No psychosis   Past Psychiatric History: depression  Previous Psychotropic Medications: Yes   Substance Abuse History in the last 12 months:  No.  Consequences of Substance Abuse: NA  Past Medical History:  Past Medical History:  Diagnosis Date  . Anxiety     Past Surgical History:  Procedure Laterality Date  . CESAREAN SECTION    . DILATION AND CURETTAGE OF UTERUS    . lumpectormy    . NO PAST SURGERIES    . TUBAL LIGATION      Family Psychiatric History: mom and dad: depression  Family History:  Family History  Problem Relation Age of Onset  . Hypertension Mother   . Alcohol abuse Father   . Hypertension Father   . Diabetes Paternal Grandmother   . Alcohol abuse Paternal Aunt   . Ovarian cancer Paternal Aunt     Social History:   Social History   Socioeconomic History  . Marital status: Divorced    Spouse name: Not on file  . Number of children: 2  . Years of education: assoc  . Highest education level: Not on file  Occupational History  . Occupation: Diplomatic Services operational officerecretary     Comment: [employer]  Social Needs  . Financial resource strain: Not on file   . Food insecurity:    Worry: Not on file    Inability: Not on file  . Transportation needs:    Medical: Not on file    Non-medical: Not on file  Tobacco Use  . Smoking status: Former Smoker    Packs/day: 0.50    Types: Cigarettes    Last attempt to quit: 05/19/2017    Years since quitting: 0.4  . Smokeless tobacco: Never Used  . Tobacco comment: last cig 05/11/17  Substance and Sexual Activity  . Alcohol use: Yes    Alcohol/week: 0.6 - 1.2 oz    Types: 1 - 2 Glasses of wine per week    Comment: rare   . Drug use: No  . Sexual activity: Yes    Partners: Male  Lifestyle  . Physical activity:    Days per week: Not on file    Minutes per session: Not on file  . Stress: Not on file  Relationships  . Social connections:    Talks on phone: Not on file    Gets together: Not on file    Attends religious service: Not on file    Active member of club or organization: Not on file    Attends meetings of clubs or organizations: Not on file    Relationship status: Not on file  Other Topics Concern  . Not on file  Social History Narrative   2 caffeinated drinks per day. No regular exercise.Lives with her fiance.       Allergies:   Allergies  Allergen Reactions  . Codeine Nausea And Vomiting    REACTION: GI Upset  . Fluoxetine Other (See Comments)    Palpitations  . Zoloft [Sertraline Hcl] Other (See Comments)    Palpitations    Metabolic Disorder Labs: No results found for: HGBA1C, MPG No results found for: PROLACTIN Lab Results  Component Value Date   CHOL 184 02/03/2016   TRIG 121 02/03/2016   HDL 46 02/03/2016   LDLCALC 128 02/03/2016     Current Medications: Current Outpatient Medications  Medication Sig Dispense Refill  . busPIRone (BUSPAR) 7.5 MG tablet Take 1 tablet (7.5 mg total) by mouth 2 (two) times daily. 60 tablet 1  . citalopram (CELEXA) 40 MG tablet Take 1 tablet (40 mg total) by mouth daily. 30 tablet 0  . hydrOXYzine (ATARAX/VISTARIL) 10 MG  tablet Take 1 tablet (10 mg total) by mouth 3 (three) times daily as needed. 30 tablet 0  . traZODone (DESYREL) 100 MG tablet Take 1 tablet (100 mg total) by mouth at bedtime. 30 tablet 1   No current facility-administered medications for this visit.       Psychiatric Specialty Exam: Review of Systems  Cardiovascular: Negative for chest pain.  Skin: Negative for rash.  Neurological: Negative for tremors.  Psychiatric/Behavioral: Negative for substance abuse. The patient is nervous/anxious.     Height 5\' 4"  (1.626 m).Body mass index is 30.04 kg/m.  General Appearance: Casual  Eye Contact:  Fair  Speech:  Slow  Volume:  Decreased  Mood:  stressed  Affect:  constricted  Thought Process:  Goal Directed  Orientation:  Full (Time, Place, and Person)  Thought Content:  Rumination  Suicidal Thoughts:  No  Homicidal Thoughts:  No  Memory:  Immediate;   Fair Recent;   Fair  Judgement:  Poor  Insight:  Shallow  Psychomotor Activity:  Decreased  Concentration:  Concentration: Fair and Attention Span: Fair  Recall:  Poor  Fund of Knowledge:Fair  Language: Fair  Akathisia:  Negative  Handed:  Right  AIMS (if indicated):  0  Assets:  Desire for Improvement  ADL's:  Intact  Cognition: WNL  Sleep:  variable    Treatment Plan Summaras followsMedication management and Plan as follows   1. Major depression severe, recurrent: stressed. Continue celexa.  Work in therapy to deal with stressors 2. GAD: anxious. Recommend take buspar twice a day instead of once Vistaril made her some sleepy. Can take prn for added anxiety Plan to increase buspar to higher dose as dose is low.  Increase acitivities  3. Insomnia: irregular sleep. Continue trazadone. Work on sleep hygiene   [id number].  Thresa Ross, MD 3/26/201910:18 AM

## 2017-11-06 ENCOUNTER — Ambulatory Visit (INDEPENDENT_AMBULATORY_CARE_PROVIDER_SITE_OTHER): Payer: Medicaid Other | Admitting: Licensed Clinical Social Worker

## 2017-11-06 DIAGNOSIS — F411 Generalized anxiety disorder: Secondary | ICD-10-CM | POA: Diagnosis not present

## 2017-11-06 DIAGNOSIS — F331 Major depressive disorder, recurrent, moderate: Secondary | ICD-10-CM | POA: Diagnosis not present
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
## 2017-11-26 ENCOUNTER — Ambulatory Visit (INDEPENDENT_AMBULATORY_CARE_PROVIDER_SITE_OTHER): Payer: Medicaid Other | Admitting: Psychiatry

## 2017-11-26 ENCOUNTER — Encounter (HOSPITAL_COMMUNITY): Payer: Self-pay | Admitting: Psychiatry

## 2017-11-26 VITALS — BP 110/70 | HR 75 | Ht 64.0 in | Wt 186.0 lb

## 2017-11-26 DIAGNOSIS — Z811 Family history of alcohol abuse and dependence: Secondary | ICD-10-CM

## 2017-11-26 DIAGNOSIS — F331 Major depressive disorder, recurrent, moderate: Secondary | ICD-10-CM

## 2017-11-26 DIAGNOSIS — G4700 Insomnia, unspecified: Secondary | ICD-10-CM

## 2017-11-26 DIAGNOSIS — Z818 Family history of other mental and behavioral disorders: Secondary | ICD-10-CM

## 2017-11-26 DIAGNOSIS — F411 Generalized anxiety disorder: Secondary | ICD-10-CM | POA: Diagnosis not present

## 2017-11-26 DIAGNOSIS — Z598 Other problems related to housing and economic circumstances: Secondary | ICD-10-CM | POA: Diagnosis not present

## 2017-11-26 DIAGNOSIS — Z87891 Personal history of nicotine dependence: Secondary | ICD-10-CM

## 2017-11-26 DIAGNOSIS — Z560 Unemployment, unspecified: Secondary | ICD-10-CM

## 2017-11-26 DIAGNOSIS — R4581 Low self-esteem: Secondary | ICD-10-CM

## 2017-11-26 MED ORDER — BUSPIRONE HCL 7.5 MG PO TABS: 7.5000 mg | ORAL_TABLET | Freq: Two times a day (BID) | ORAL | 1 refills | Status: AC

## 2017-11-26 MED ORDER — CITALOPRAM HYDROBROMIDE 40 MG PO TABS: 40.0000 mg | ORAL_TABLET | Freq: Every day | ORAL | 1 refills | Status: AC

## 2017-11-26 MED ORDER — HYDROXYZINE HCL 10 MG PO TABS: 10.0000 mg | ORAL_TABLET | Freq: Three times a day (TID) | ORAL | 1 refills | Status: AC | PRN

## 2017-11-26 NOTE — Progress Notes (Signed)
[facility] outpatient Follow up visit   Patient Identification: Hailey Wolf MRN:  [id number] Date of Evaluation:  11/26/2017 Referral Source: Primary care Konrad Penta  Chief Complaint:    Visit Diagnosis:    ICD-10-CM   1. Moderate episode of recurrent major depressive disorder (HCC) F33.1   2. GAD (generalized anxiety disorder) F41.1     History of Present Illness:  47 years old divorced female, living with mom.  Recently admitted to hospital in [place] due to severe depression and suicidal thougths  Has been having headache. Saw neurology. On prn imitrex Adding buspar has helped some anxiety Dad is sick, he fell Stressor and finances keep her low Motivation not worse External stressors being dad is sick . No job  Feels low esteem. Has started therapy  Grandfather sick Aggravating factor: finances, lost job before  No psychosis,  Modifying factors: mom, kids   anxiety fluctuates  No psychosis   Past Psychiatric History: depression  Previous Psychotropic Medications: Yes   Substance Abuse History in the last 12 months:  No.  Consequences of Substance Abuse: NA  Past Medical History:  Past Medical History:  Diagnosis Date  . Anxiety     Past Surgical History:  Procedure Laterality Date  . CESAREAN SECTION    . DILATION AND CURETTAGE OF UTERUS    . lumpectormy    . NO PAST SURGERIES    . TUBAL LIGATION      Family Psychiatric History: mom and dad: depression  Family History:  Family History  Problem Relation Age of Onset  . Hypertension Mother   . Alcohol abuse Father   . Hypertension Father   . Diabetes Paternal Grandmother   . Alcohol abuse Paternal Aunt   . Ovarian cancer Paternal Aunt     Social History:   Social History   Socioeconomic History  . Marital status: Divorced    Spouse name: Not on file  . Number of children: 2  . Years of education: assoc  . Highest education level: Not on file  Occupational History  . 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 Financial resource strain: Not on file  . Food insecurity:    Worry: Not on file    Inability: Not on file  . Transportation needs:    Medical: Not on file    Non-medical: Not on file  Tobacco Use  . Smoking status: Former Smoker    Packs/day: 0.50    Types: Cigarettes    Last attempt to quit: 05/19/2017    Years since quitting: 0.5  . Smokeless tobacco: Never Used  . Tobacco comment: last cig 05/11/17  Substance and Sexual Activity  . Alcohol use: Yes    Alcohol/week: 0.6 - 1.2 oz    Types: 1 - 2 Glasses of wine per week    Comment: rare   . Drug use: No  . Sexual activity: Yes    Partners: Male  Lifestyle  . Physical activity:    Days per week: Not on file    Minutes per session: Not on file  . Stress: Not on file  Relationships  . Social connections:    Talks on phone: Not on file    Gets together: Not on file    Attends religious service: Not on file    Active member of club or organization: Not on file    Attends meetings of clubs or organizations: Not on file    Relationship  status: Not on file  Other Topics Concern  . Not on file  Social History Narrative   2 caffeinated drinks per day. No regular exercise.Lives with her fiance.       Allergies:   Allergies  Allergen Reactions  . Codeine Nausea And Vomiting    REACTION: GI Upset  . Fluoxetine Other (See Comments)    Palpitations  . Zoloft [Sertraline Hcl] Other (See Comments)    Palpitations    Metabolic Disorder Labs: No results found for: HGBA1C, MPG No results found for: PROLACTIN Lab Results  Component Value Date   CHOL 184 02/03/2016   TRIG 121 02/03/2016   HDL 46 02/03/2016   LDLCALC 128 02/03/2016     Current Medications: Current Outpatient Medications  Medication Sig Dispense Refill  . busPIRone (BUSPAR) 7.5 MG tablet Take 1 tablet (7.5 mg total) by mouth 2 (two) times daily. 60 tablet 1  . citalopram (CELEXA) 40 MG tablet Take  1 tablet (40 mg total) by mouth daily. 30 tablet 1  . hydrOXYzine (ATARAX/VISTARIL) 10 MG tablet Take 1 tablet (10 mg total) by mouth 3 (three) times daily as needed. 30 tablet 1  . SUMAtriptan (IMITREX) 25 MG tablet Take 25 mg by mouth every 2 (two) hours as needed for migraine. May repeat in 2 hours if headache persists or recurs.    . traZODone (DESYREL) 100 MG tablet Take 1 tablet (100 mg total) by mouth at bedtime. 30 tablet 1   No current facility-administered medications for this visit.       Psychiatric Specialty Exam: Review of Systems  Cardiovascular: Negative for chest pain.  Skin: Negative for rash.  Neurological: Negative for tremors.  Psychiatric/Behavioral: Negative for substance abuse.    Blood pressure 110/70, pulse 75, height  (1.626 m), weight 186 lb (84.4 kg), SpO2 99 %.Body mass index is 31.93 kg/m.  General Appearance: Casual  Eye Contact:  Fair  Speech:  Slow  Volume:  Decreased  Mood:  Somewhat stressed  Affect:  constricted  Thought Process:  Goal Directed  Orientation:  Full (Time, Place, and Person)  Thought Content:  Rumination  Suicidal Thoughts:  No  Homicidal Thoughts:  No  Memory:  Immediate;   Fair Recent;   Fair  Judgement:  Poor  Insight:  Shallow  Psychomotor Activity:  Decreased  Concentration:  Concentration: Fair and Attention Span: Fair  Recall:  Poor  Fund of Knowledge:Fair  Language: Fair  Akathisia:  Negative  Handed:  Right  AIMS (if indicated):  0  Assets:  Desire for Improvement  ADL's:  Intact  Cognition: WNL  Sleep:  variable    Treatment Plan Summaras followsMedication management and Plan as follows   1. Major depression severe, recurrent: not worse. Continue celexa. Still gets subdued 2. GAD: ongoing under stress. Continue buspar. Will re instate vistaril Consider therapy  Plan to increase buspar to higher dose as dose is low.  Increase acitivities  3. Insomnia: not worse. Takes trazadone. Will continue  prn   fu4-6w.  Thresa Ross, MD 4/29/201910:05 AM

## 2017-12-06 ENCOUNTER — Ambulatory Visit (INDEPENDENT_AMBULATORY_CARE_PROVIDER_SITE_OTHER): Payer: Medicaid Other | Admitting: Licensed Clinical Social Worker

## 2017-12-06 DIAGNOSIS — F331 Major depressive disorder, recurrent, moderate: Secondary | ICD-10-CM

## 2017-12-06 DIAGNOSIS — F411 Generalized anxiety disorder: Secondary | ICD-10-CM | POA: Diagnosis not present

## 2017-12-06 NOTE — Progress Notes (Signed)
   THERAPIST PROGRESS NOTE  Session Time: 11:02 AM to 12 PM  Participation Level: Active  Behavioral Response: CasualAlertEuthymic  Type of Therapy: Individual Therapy  Treatment Goals addressed: reduce overall level, frequency and intensity of the anxiety so that daily functioning is not impaired, work on coping skills, increase in self-esteem and decrease of trauma symptoms  Interventions: CBT, Solution Focused, Strength-based, Supportive and Reframing  Summary: Hailey Wolf is a 47 y.o. female who presents with moderate episode of recurrent major depressive disorder, generalized anxiety disorder.    Suicidal/Homicidal: No  Therapist Response: Patient checked in and related that she feels overwhelmed, described different reasons including her father fell again, daughter's birthday coming up and feels bad she doesn't have anything, hard to see her disappointed, just found out ex-husband remarried. Relates that it reminds her that she is standing still in her life. Therapist pointed out the different role she has now taking care of family, help her to see her value in new role that she has, help in adjusting to change in a positive way. Shares she feels a lot of guilt toward children, relates that it was traumatic for them when she left, did not put them first, why did she do it that to them and knows she needs to think about what she can do now. Therapist pointed out very real symptoms she has experienced of trauma and depression, help her to be more forgiving to self, realize her being there now is important so they don't have to worry something may happen. Patient shares feelings that she lost everything, nothing left, has a negative outlook on things and would prefer to stay in her safety zone at home in living room. She feels she is a weak person and has never been a weak person. Broken and can't get up. Therapist worked on Psychologist, clinical and changing perspective how depression and trauma  distorts perspective and worked on Risk manager of self with value and compassion. Patient describes being on a roller coaster with emotions, she will be alright and then anxiety, thoughts that bring pain and guilt. Think about it and then can't breath, describes herself as a terrible person, exhausted. Therapist challenged patient with realizing nature of life and challenges come but also will have positive experiences in future. Assessed patient current functioning per report. Therapist help patient to process through feelings related to recent incidents that have have caused more depression. Therapist utilized reframing and looking at incidences to help with coping. Therapist identified negative thoughts about self in the world as impacted by trauma and depression, therapist worked on building patient's awareness of this to help her and challenging these thoughts, also directly challenged distortions in thinking. Therapist work with patient understanding she's been impacted by trauma, to have a better understanding of symptoms and also to build self compassion, to work on not being so hard on herself. Therapist worked on identifying patient's strengths, work with patient on recognition of positive experiences that will be part of her future as a truth of life, helping her see her value to help her deal with losses. Therapist provided supportive interventions  Plan: Return again in 2 weeks.2.therapist work with patient on stress management, coping,  Diagnosis: Axis I: moderate episode of recurrent major depressive disorder, generalized anxiety disorder    Axis II: No diagnosis    Coolidge Breeze, LCSW 12/06/2017

## 2017-12-20 ENCOUNTER — Ambulatory Visit (INDEPENDENT_AMBULATORY_CARE_PROVIDER_SITE_OTHER): Payer: Medicaid Other | Admitting: Licensed Clinical Social Worker

## 2017-12-20 DIAGNOSIS — F331 Major depressive disorder, recurrent, moderate: Secondary | ICD-10-CM

## 2017-12-20 DIAGNOSIS — F411 Generalized anxiety disorder: Secondary | ICD-10-CM | POA: Diagnosis not present

## 2017-12-20 NOTE — Progress Notes (Signed)
THERAPIST PROGRESS NOTE  Session Time: of 11:02 AM to 11:57 AM  Participation Level: Active  Behavioral Response: CasualAlertDysphoric and tearful  Type of Therapy: Individual Therapy  Treatment Goals addressed: patient continue to work on decrease in depression, anxiety, improve self-esteem, address trauma issues, coping  Interventions: CBT, Solution Focused, Strength-based, Supportive and Reframing, education about trauma interventions  Summary: Hailey Wolf is a 47 y.o. female who presents with moderate episode of recurrent major depressive disorder, generalized anxiety disorder.     Suicidal/Homicidal: passive SI, preventative factor patient's children, support from her family  Therapist Response: Patient checked in and related current symptoms of frustration and disgust, related some of disgust turned to self. Shares that she does not feel mentally, physically can work. Describes always so tense from anxiety, anything she has to do in the day, a daily task, she gets exhausted from the task. Realizes that she went through a lot of things all at once. Patent shares feeling in the same place. Therapist discussed analogy of "deer in the headlights" and that taking one step at a time, such as starting with part-time job can be helpful. Patient describes the pressure she feels from herself and from family while being stuck and unable to move forward. Discussed trauma symptoms cause one one to still be experiencing impact of trauma in present, by reviewing trauma history, help her to restructure memories to help her move on.(Will continue to access with patient helpfulness of trauma interventions as patient obtains more stability and coping with symptoms). Patient describes feelings of not wanting to be here but recognizing the pivotal she placed with children, how important she is to her children, discussed negative cognitions including not being able to give children what they need,  falling short, thinks about what she did to them, feels bad and and worries about how they're going to be, feels neglected them. Therapist worked on challenging these negative distorted thoughts. Patient shared being very critical of choices she made and how she treated them when she was involved in relationship, asked herself how she could have done those things, ruminates about why she did these things, what she did to lose her job. Therapist pointed out though theme shame that patient needs to work through in treatment. Therapist discussed ways to get out of ruminating thoughts by rechanneling thoughts and getting out of had to physical activity. Patient feels constantly overwhelmed. Therapist discussed how patient does not seem to want to move forward with her her life, explored negative projections and challenged these distortions. Patient shares worried about everything, "the whole world". Shares thoughts of always being alone. Therapist challenged patient on judging herself and working on appreciating herself in therapy. Assess patient current functioning per report and reviewed treatment plan. Identified continuing severe symptoms of anxiety and depression. Discussed with patient therapist impression of being stuck, not being able to move forward, explored patient's perception of future, questioned perception is negative and challenged this perspective to more realistic 1 that is more positive. Discuss trauma interventions that could be helpful including helping her get unstuck by discussing past trauma, helping her to habituate to emotions from trauma helping her restore memories that are still being experienced in the present. Discussed with patient right now feeling overwhelmed, so right now working with this therapist continues to be helpful in providing support and an outlet. Therapist identified issues of shame that are keeping patient stuck, work with patient on changing perceptions of self and  negative thought patterns that  are self-defeating and not accurate. Utilize strength-based interventions in discussing the support patient is giving to parents the importance of patient and lies of children. Encouraged patient and incremental steps to help her move forward. Provided supportive interventions Plan: Return again in 2 weeks.2.therapist continued to work with patient on processing through negative, distorted thoughts, work on shame issues, work on building self-esteem  Diagnosis: Axis I: moderate episode of recurrent major depressive disorder, generalized anxiety disorder   Axis II: No diagnosis    Mary Coolidge BreezeKentucky 12/20/2017

## 2018-01-01 ENCOUNTER — Ambulatory Visit (HOSPITAL_COMMUNITY): Payer: Self-pay | Admitting: Licensed Clinical Social Worker

## 2018-01-03 ENCOUNTER — Other Ambulatory Visit (HOSPITAL_COMMUNITY): Payer: Self-pay | Admitting: Psychiatry

## 2018-01-15 ENCOUNTER — Ambulatory Visit (INDEPENDENT_AMBULATORY_CARE_PROVIDER_SITE_OTHER): Payer: Medicaid Other | Admitting: Licensed Clinical Social Worker

## 2018-01-15 DIAGNOSIS — F331 Major depressive disorder, recurrent, moderate: Secondary | ICD-10-CM | POA: Diagnosis not present

## 2018-01-15 DIAGNOSIS — F411 Generalized anxiety disorder: Secondary | ICD-10-CM
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
## 2018-01-24 ENCOUNTER — Ambulatory Visit (HOSPITAL_COMMUNITY): Payer: Self-pay | Admitting: Psychiatry

## 2018-02-07 ENCOUNTER — Ambulatory Visit (INDEPENDENT_AMBULATORY_CARE_PROVIDER_SITE_OTHER): Payer: Medicaid Other | Admitting: Licensed Clinical Social Worker

## 2018-02-07 DIAGNOSIS — F411 Generalized anxiety disorder: Secondary | ICD-10-CM

## 2018-02-07 DIAGNOSIS — F331 Major depressive disorder, recurrent, moderate: Secondary | ICD-10-CM | POA: Diagnosis not present

## 2018-02-07 DIAGNOSIS — F4001 Agoraphobia with panic disorder: Secondary | ICD-10-CM

## 2018-02-07 DIAGNOSIS — F4310 Post-traumatic stress disorder, unspecified: Secondary | ICD-10-CM
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
## 2018-03-05 ENCOUNTER — Ambulatory Visit (INDEPENDENT_AMBULATORY_CARE_PROVIDER_SITE_OTHER): Payer: Medicaid Other | Admitting: Licensed Clinical Social Worker

## 2018-03-05 DIAGNOSIS — F411 Generalized anxiety disorder: Secondary | ICD-10-CM | POA: Diagnosis not present

## 2018-03-05 DIAGNOSIS — F4310 Post-traumatic stress disorder, unspecified: Secondary | ICD-10-CM | POA: Diagnosis not present

## 2018-03-05 DIAGNOSIS — F4001 Agoraphobia with panic disorder: Secondary | ICD-10-CM

## 2018-03-05 DIAGNOSIS — F331 Major depressive disorder, recurrent, moderate: Secondary | ICD-10-CM | POA: Diagnosis not present
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
## 2018-03-08 ENCOUNTER — Telehealth (HOSPITAL_COMMUNITY): Payer: Self-pay | Admitting: Licensed Clinical Social Worker

## 2018-03-08 NOTE — Telephone Encounter (Signed)
Touched base with patient's psychiatrist, doctor Marnette Burgessziz, for coordination of care and doctor and this therapist noted progress on new meds, Depakote. Discussed diagnosis of Bipolar, in particular, noted impulsivity in history that fits with this diagnosis.

## 2018-03-08 NOTE — Telephone Encounter (Signed)
Therapist left message for Dr. Marn[facility] at [facility] for coordination of care

## 2018-03-21 ENCOUNTER — Ambulatory Visit (INDEPENDENT_AMBULATORY_CARE_PROVIDER_SITE_OTHER): Payer: Medicaid Other | Admitting: Licensed Clinical Social Worker

## 2018-03-21 DIAGNOSIS — F411 Generalized anxiety disorder: Secondary | ICD-10-CM | POA: Diagnosis not present

## 2018-03-21 DIAGNOSIS — F4310 Post-traumatic stress disorder, unspecified: Secondary | ICD-10-CM | POA: Diagnosis not present

## 2018-03-21 DIAGNOSIS — F4001 Agoraphobia with panic disorder: Secondary | ICD-10-CM

## 2018-03-21 DIAGNOSIS — F331 Major depressive disorder, recurrent, moderate: Secondary | ICD-10-CM
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
## 2018-04-16 ENCOUNTER — Ambulatory Visit (INDEPENDENT_AMBULATORY_CARE_PROVIDER_SITE_OTHER): Payer: Medicaid Other | Admitting: Licensed Clinical Social Worker

## 2018-04-16 DIAGNOSIS — F411 Generalized anxiety disorder: Secondary | ICD-10-CM

## 2018-04-16 DIAGNOSIS — F4310 Post-traumatic stress disorder, unspecified: Secondary | ICD-10-CM

## 2018-04-16 DIAGNOSIS — F4001 Agoraphobia with panic disorder: Secondary | ICD-10-CM | POA: Diagnosis not present

## 2018-04-16 DIAGNOSIS — F331 Major depressive disorder, recurrent, moderate: Secondary | ICD-10-CM | POA: Diagnosis not present
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
## 2018-05-29 ENCOUNTER — Telehealth (HOSPITAL_COMMUNITY): Payer: Self-pay | Admitting: Licensed Clinical Social Worker

## 2018-05-29 NOTE — Telephone Encounter (Signed)
Therapist provided patient with resources for EMDR

## 2018-06-25 ENCOUNTER — Telehealth (HOSPITAL_COMMUNITY): Payer: Self-pay | Admitting: Licensed Clinical Social Worker

## 2018-06-25 NOTE — Telephone Encounter (Signed)
[facility] Wolf [facility] is calling in regards to this patient that you referred to her. She would just like to follow up with you for a moments.   CB # [phone]

## 2018-07-05 NOTE — Telephone Encounter (Signed)
Hailey Wolf, please document if you have called casey back please.  This not needs to be signed and closed.

## 2019-06-06 DIAGNOSIS — F4310 Post-traumatic stress disorder, unspecified: Secondary | ICD-10-CM | POA: Insufficient documentation

## 2019-06-06 DIAGNOSIS — F5105 Insomnia due to other mental disorder: Secondary | ICD-10-CM | POA: Insufficient documentation

## 2019-06-06 DIAGNOSIS — F411 Generalized anxiety disorder: Secondary | ICD-10-CM | POA: Insufficient documentation

## 2019-06-06 DIAGNOSIS — F4001 Agoraphobia with panic disorder: Secondary | ICD-10-CM | POA: Insufficient documentation

## 2019-06-06 DIAGNOSIS — F3132 Bipolar disorder, current episode depressed, moderate: Secondary | ICD-10-CM | POA: Insufficient documentation

## 2019-06-23 IMAGING — DX DG CHEST 2V
2 series · 2 of 2 positions shown · non-contrast
Comparison: None.

CLINICAL DATA: Left-sided chest pain for 1 week

EXAM:
CHEST  2 VIEW

[chest ap]
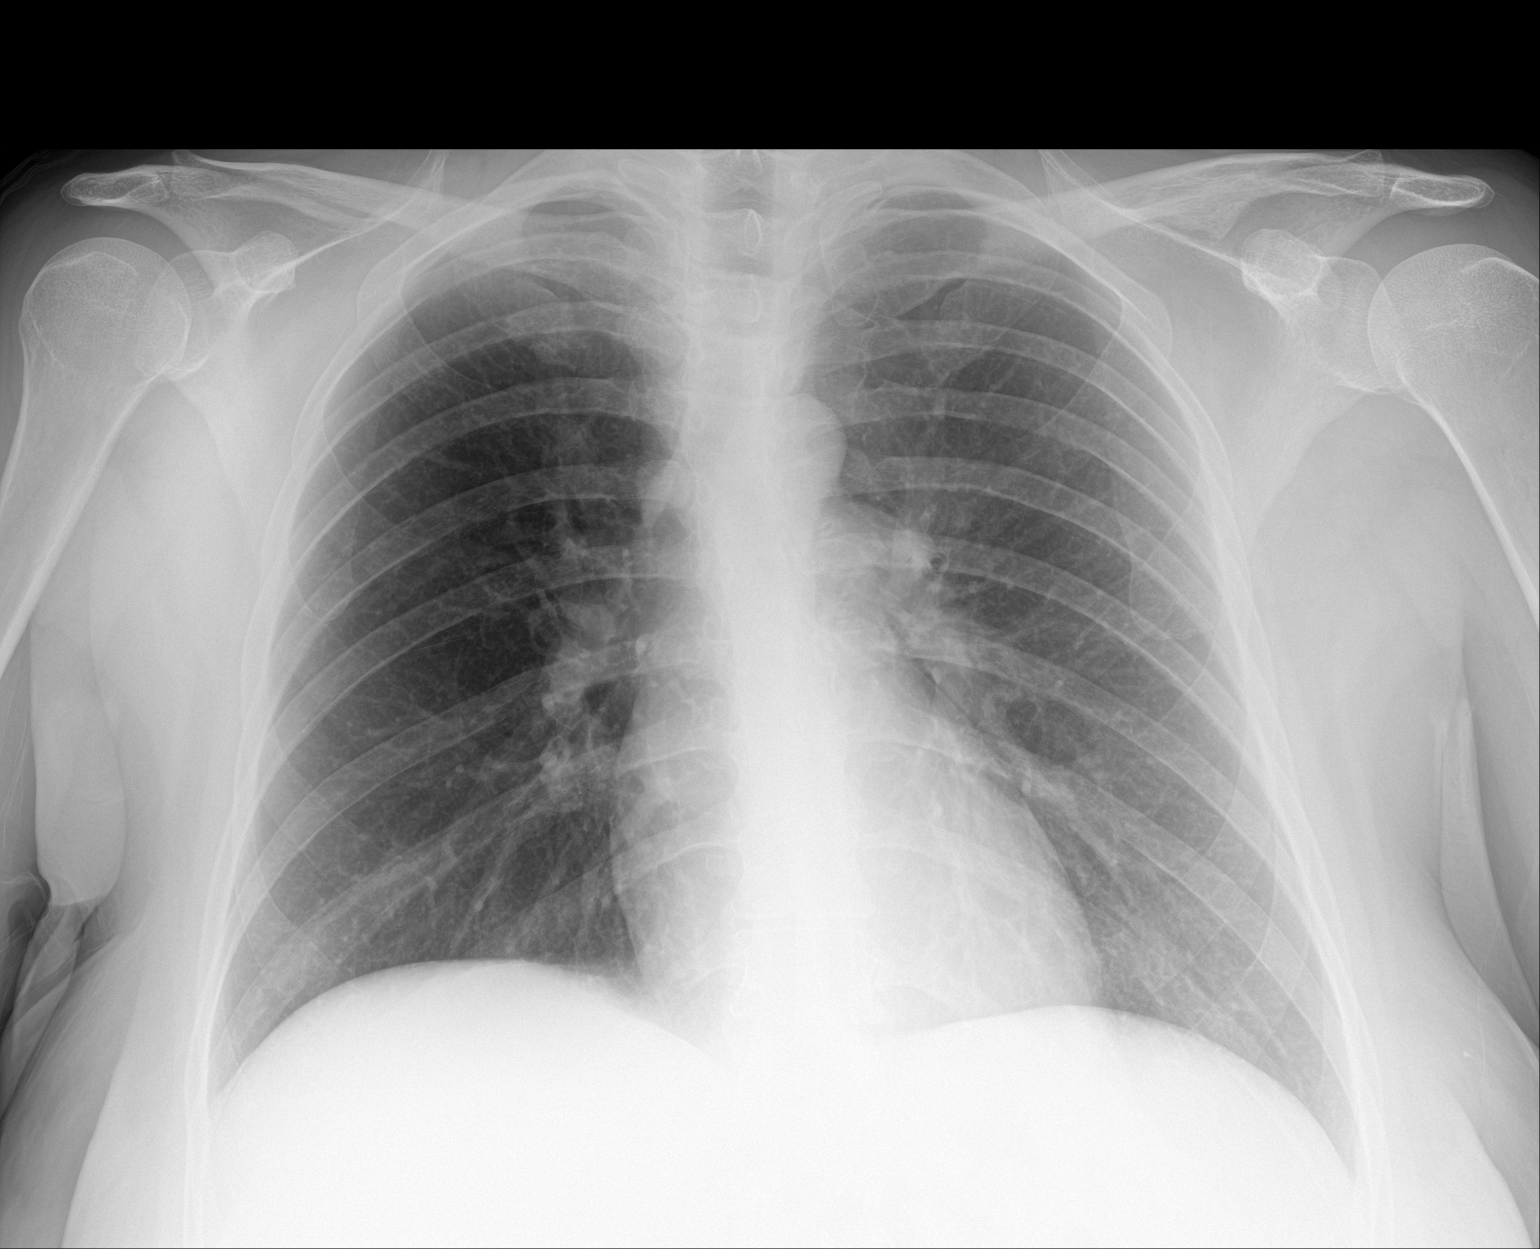

[w chest lat]
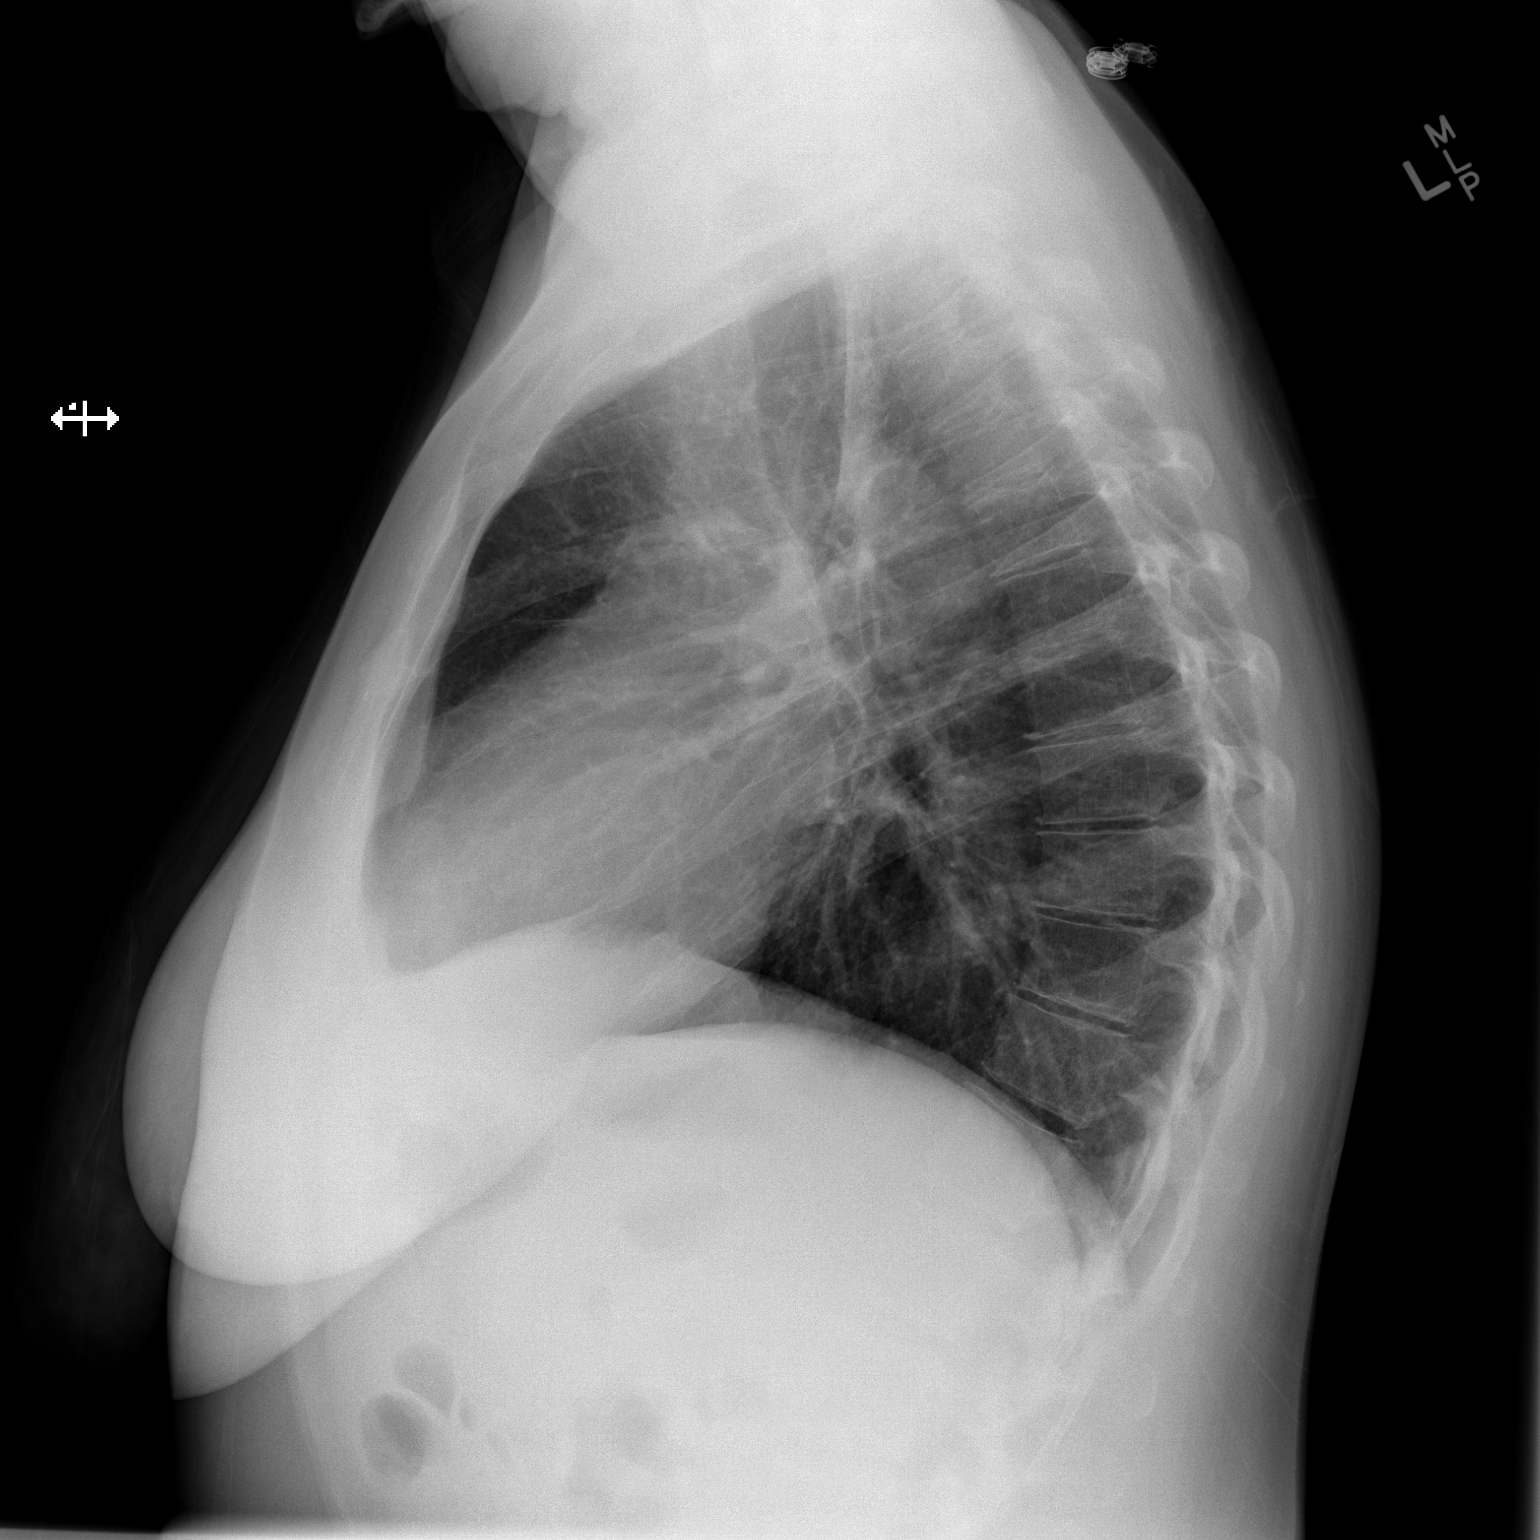

[2 of 2 positions shown; findings below may reference images not displayed]

FINDINGS: The heart size and mediastinal contours are within normal limits.
Both lungs are clear. The visualized skeletal structures are
unremarkable.
IMPRESSION: No active cardiopulmonary disease.

## 2022-12-04 ENCOUNTER — Emergency Department (HOSPITAL_BASED_OUTPATIENT_CLINIC_OR_DEPARTMENT_OTHER)
Admission: EM | Admit: 2022-12-04 | Discharge: 2022-12-04 | Disposition: A | Discharge status: Home / Self Care | Payer: 59 | Attending: Emergency Medicine | Admitting: Emergency Medicine

## 2022-12-04 ENCOUNTER — Encounter (HOSPITAL_BASED_OUTPATIENT_CLINIC_OR_DEPARTMENT_OTHER): Payer: Self-pay | Admitting: Urology

## 2022-12-04 DIAGNOSIS — F419 Anxiety disorder, unspecified: Secondary | ICD-10-CM | POA: Diagnosis not present

## 2022-12-04 DIAGNOSIS — F418 Other specified anxiety disorders: Secondary | ICD-10-CM

## 2022-12-04 NOTE — ED Triage Notes (Signed)
Per EMS is having anxiety  States syncopal episodes with anxiety  Not given anything in route VS wnl   Took klonopin and it did not work

## 2022-12-04 NOTE — Discharge Instructions (Signed)
Follow up with your doctor in the office.  Return for worsening or persistent symptoms.
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
## 2022-12-04 NOTE — ED Provider Notes (Signed)
[facility] Provider Note   CSN: [id number] Arrival date & time: 12/04/22  1049     History  Chief Complaint  Patient presents with   Anxiety    Hailey Wolf is a 52 y.o. female.  52 yo F with a chief complaints of anxiety.  She has a history of anxiety and takes Klonopin for this.  She felt like the situation she was and made her feel very anxious.  She took a Klonopin and did not feel immediately better and EMS was called and she was brought here for evaluation.  She does think that she passed out 2 or 3 times while in the scene.  She said that is normal for her.  She is feeling much better and would like to go home.   Anxiety       Home Medications Prior to Admission medications   Medication Sig Start Date End Date Taking? Authorizing Provider  citalopram (CELEXA) 40 MG tablet Take 1 tablet (40 mg total) by mouth daily. 11/26/17   Thresa Ross, MD  citalopram (CELEXA) 40 MG tablet Take by mouth. 09/24/17   [provider]  clonazePAM [name]) 0.5 MG tablet Take by mouth. 01/22/18 03/23/18  [provider]  divalproex (DEPAKOTE ER) 500 MG 24 hr tablet  01/22/18   [provider]  hydrOXYzine (ATARAX/VISTARIL) 10 MG tablet Take 1 tablet (10 mg total) by mouth 3 (three) times daily as needed. 11/26/17   Thresa Ross, MD  SUMAtriptan (IMITREX) 25 MG tablet Take 25 mg by mouth every 2 (two) hours as needed for migraine. May repeat in 2 hours if headache persists or recurs.    [provider]  traZODone (DESYREL) 100 MG tablet TAKE 1 TABLET(100 MG) BY MOUTH AT BEDTIME 01/03/18   Thresa Ross, MD  diphenhydrAMINE (BENADRYL) 25 mg capsule Take 25 mg by mouth at bedtime as needed for sleep.  05/31/17  [provider]  escitalopram (LEXAPRO) 20 MG tablet Take 1.5 tablets (30 mg total) by mouth daily. 08/16/17 09/24/17  Thresa Ross, MD  gabapentin (NEURONTIN) 300 MG capsule Take 300 mg by  mouth. 05/21/17 05/31/17  [provider]      Allergies    Codeine, Fluoxetine, and Zoloft [sertraline hcl]    Review of Systems   Review of Systems  Physical Exam Updated Vital Signs BP (!) 146/80 (BP Location: Right Arm)   Pulse 78   Temp 98 F (36.7 C) (Oral)   Resp 18   Ht 5\' 4"  (1.626 m)   Wt 84.4 kg   SpO2 100%   BMI 31.94 kg/m  Physical Exam Vitals and nursing note reviewed.  Constitutional:      General: She is not in acute distress.    Appearance: She is well-developed. She is not diaphoretic.  HENT:     Head: Normocephalic and atraumatic.  Eyes:     Pupils: Pupils are equal, round, and reactive to light.  Cardiovascular:     Rate and Rhythm: Normal rate and regular rhythm.     Heart sounds: No murmur heard.    No friction rub. No gallop.  Pulmonary:     Effort: Pulmonary effort is normal.     Breath sounds: No wheezing or rales.  Abdominal:     General: There is no distension.     Palpations: Abdomen is soft.     Tenderness: There is no abdominal tenderness.  Musculoskeletal:  General: No tenderness.     Cervical back: Normal range of motion and neck supple.  Skin:    General: Skin is warm and dry.  Neurological:     Mental Status: She is alert and oriented to person, place, and time.  Psychiatric:        Behavior: Behavior normal.     ED Results / Procedures / Treatments   Labs (all labs ordered are listed, but only abnormal results are displayed) Labs Reviewed - No data to display  EKG EKG Interpretation  Date/Time:  Monday Dec 04 2022 11:12:04 EDT Ventricular Rate:  77 PR Interval:  146 QRS Duration: 80 QT Interval:  414 QTC Calculation: 469 R Axis:   53 Text Interpretation: Sinus rhythm no wpw, prolonged qt or brugada No significant change since last tracing Confirmed by Melene Plan [phone]) on 12/04/2022 11:14:19 AM  Radiology No results found.  Procedures Procedures    Medications Ordered in ED Medications - No  data to display  ED Course/ Medical Decision Making/ A&P                             Medical Decision Making Amount and/or Complexity of Data Reviewed ECG/medicine tests: ordered.   52 yo F with a chief complaints of what sounds like situational anxiety.  The patient was in a situation where police were called and was just incarcerated and so felt quite anxious when they arrived.  She reportedly passed out multiple times on the scene.  She feels much better now would like to go home.  Will obtain an EKG.  EKG without obvious concerning finding.  Will discharge home.  PCP follow-up.  11:14 AM:  I have discussed the diagnosis/risks/treatment options with the patient.  Evaluation and diagnostic testing in the emergency department does not suggest an emergent condition requiring admission or immediate intervention beyond what has been performed at this time.  They will follow up with PCP. We also discussed returning to the ED immediately if new or worsening sx occur. We discussed the sx which are most concerning (e.g., sudden worsening pain, fever, inability to tolerate by mouth) that necessitate immediate return. Medications administered to the patient during their visit and any new prescriptions provided to the patient are listed below.  Medications given during this visit Medications - No data to display   The patient appears reasonably screen and/or stabilized for discharge and I doubt any other medical condition or other [facility] requiring further screening, evaluation, or treatment in the ED at this time prior to discharge.           Final Clinical Impression(s) / ED Diagnoses Final diagnoses:  Situational anxiety    Rx / DC Orders ED Discharge Orders     None         Melene Plan, DO 12/04/22 1114

## 2022-12-04 NOTE — ED Notes (Signed)
Cab ordered for patient

## 2022-12-10 ENCOUNTER — Other Ambulatory Visit: Payer: Self-pay

## 2022-12-10 NOTE — ED Triage Notes (Signed)
Pt eloped prior to triage. Per EMS, pt called EMS for anxiety. Pt stable and ambulatory walking out of the ED. Told EMS she ran out gas when they picked her up.

## 2022-12-22 ENCOUNTER — Emergency Department (HOSPITAL_BASED_OUTPATIENT_CLINIC_OR_DEPARTMENT_OTHER)
Admission: EM | Admit: 2022-12-22 | Discharge: 2022-12-22 | Disposition: A | Discharge status: Home / Self Care | Payer: 59 | Attending: Emergency Medicine | Admitting: Emergency Medicine

## 2022-12-22 ENCOUNTER — Encounter (HOSPITAL_BASED_OUTPATIENT_CLINIC_OR_DEPARTMENT_OTHER): Payer: Self-pay | Admitting: Emergency Medicine

## 2022-12-22 ENCOUNTER — Emergency Department (HOSPITAL_BASED_OUTPATIENT_CLINIC_OR_DEPARTMENT_OTHER): Payer: 59

## 2022-12-22 ENCOUNTER — Other Ambulatory Visit: Payer: Self-pay

## 2022-12-22 DIAGNOSIS — R112 Nausea with vomiting, unspecified: Secondary | ICD-10-CM

## 2022-12-22 DIAGNOSIS — J320 Chronic maxillary sinusitis: Secondary | ICD-10-CM | POA: Diagnosis not present

## 2022-12-22 DIAGNOSIS — F29 Unspecified psychosis not due to a substance or known physiological condition: Secondary | ICD-10-CM | POA: Diagnosis not present

## 2022-12-22 DIAGNOSIS — R4182 Altered mental status, unspecified: Secondary | ICD-10-CM | POA: Insufficient documentation

## 2022-12-22 DIAGNOSIS — R509 Fever, unspecified: Secondary | ICD-10-CM | POA: Diagnosis not present

## 2022-12-22 DIAGNOSIS — Z20822 Contact with and (suspected) exposure to covid-19: Secondary | ICD-10-CM | POA: Diagnosis not present

## 2022-12-22 DIAGNOSIS — E876 Hypokalemia: Secondary | ICD-10-CM | POA: Insufficient documentation

## 2022-12-22 LAB — COMPREHENSIVE METABOLIC PANEL
ALT: 15 U/L (ref 0–44)
AST: 20 U/L (ref 15–41)
Albumin: 3.4 g/dL — ABNORMAL LOW (ref 3.5–5.0)
Alkaline Phosphatase: 64 U/L (ref 38–126)
Anion gap: 7 (ref 5–15)
BUN: 8 mg/dL (ref 6–20)
CO2: 26 mmol/L (ref 22–32)
Calcium: 8.5 mg/dL — ABNORMAL LOW (ref 8.9–10.3)
Chloride: 103 mmol/L (ref 98–111)
Creatinine, Ser: 0.64 mg/dL (ref 0.44–1.00)
GFR, Estimated: >60 mL/min (ref >60)
Glucose, Bld: 87 mg/dL (ref 70–99)
Potassium: 2.6 mmol/L — CL (ref 3.5–5.1)
Sodium: 136 mmol/L (ref 135–145)
Total Bilirubin: 0.9 mg/dL (ref 0.3–1.2)
Total Protein: 7 g/dL (ref 6.5–8.1)

## 2022-12-22 LAB — CBC WITH DIFFERENTIAL/PLATELET
Abs Immature Granulocytes: 0.02 10*3/uL (ref 0.00–0.07)
Basophils Absolute: 0 10*3/uL (ref 0.0–0.1)
Basophils Relative: 1 %
Eosinophils Absolute: 0.1 10*3/uL (ref 0.0–0.5)
Eosinophils Relative: 1 %
HCT: 36.4 % (ref 36.0–46.0)
Hemoglobin: 11.7 g/dL — ABNORMAL LOW (ref 12.0–15.0)
Immature Granulocytes: 0 %
Lymphocytes Relative: 35 %
Lymphs Abs: 2.3 10*3/uL (ref 0.7–4.0)
MCH: 25.3 pg — ABNORMAL LOW (ref 26.0–34.0)
MCHC: 32.1 g/dL (ref 30.0–36.0)
MCV: 78.6 fL — ABNORMAL LOW (ref 80.0–100.0)
Monocytes Absolute: 0.5 10*3/uL (ref 0.1–1.0)
Monocytes Relative: 7 %
Neutro Abs: 3.7 10*3/uL (ref 1.7–7.7)
Neutrophils Relative %: 56 %
Platelets: 298 10*3/uL (ref 150–400)
RBC: 4.63 MIL/uL (ref 3.87–5.11)
RDW: 15.4 % (ref 11.5–15.5)
WBC: 6.6 10*3/uL (ref 4.0–10.5)
nRBC: 0 % (ref 0.0–0.2)

## 2022-12-22 LAB — CBG MONITORING, ED: Glucose-Capillary: 85 mg/dL (ref 70–99)

## 2022-12-22 LAB — URINALYSIS, ROUTINE W REFLEX MICROSCOPIC
Glucose, UA: NEGATIVE mg/dL
Ketones, ur: NEGATIVE mg/dL
Nitrite: NEGATIVE
Protein, ur: NEGATIVE mg/dL
Specific Gravity, Urine: >=1.03 (ref 1.005–1.030)
pH: 5.5 (ref 5.0–8.0)

## 2022-12-22 LAB — I-STAT VENOUS BLOOD GAS, ED
Acid-Base Excess: 0 mmol/L (ref 0.0–2.0)
Bicarbonate: 26.2 mmol/L (ref 20.0–28.0)
Calcium, Ion: 1.19 mmol/L (ref 1.15–1.40)
HCT: 33 % — ABNORMAL LOW (ref 36.0–46.0)
Hemoglobin: 11.2 g/dL — ABNORMAL LOW (ref 12.0–15.0)
O2 Saturation: 73 %
Patient temperature: 98.1
Potassium: 3.1 mmol/L — ABNORMAL LOW (ref 3.5–5.1)
Sodium: 140 mmol/L (ref 135–145)
TCO2: 28 mmol/L (ref 22–32)
pCO2, Ven: 46.2 mmHg (ref 44–60)
pH, Ven: 7.36 (ref 7.25–7.43)
pO2, Ven: 40 mmHg (ref 32–45)

## 2022-12-22 LAB — RAPID URINE DRUG SCREEN, HOSP PERFORMED
Amphetamines: NOT DETECTED
Barbiturates: NOT DETECTED
Benzodiazepines: NOT DETECTED
Cocaine: NOT DETECTED
Opiates: NOT DETECTED
Tetrahydrocannabinol: POSITIVE — AB

## 2022-12-22 LAB — URINALYSIS, MICROSCOPIC (REFLEX)

## 2022-12-22 LAB — LACTIC ACID, PLASMA: Lactic Acid, Venous: 1.1 mmol/L (ref 0.5–1.9)

## 2022-12-22 LAB — LIPASE, BLOOD: Lipase: 25 U/L (ref 11–51)

## 2022-12-22 LAB — PREGNANCY, URINE: Preg Test, Ur: NEGATIVE

## 2022-12-22 LAB — RESP PANEL BY RT-PCR (RSV, FLU A&B, COVID)  RVPGX2
Influenza A by PCR: NEGATIVE
Influenza B by PCR: NEGATIVE
Resp Syncytial Virus by PCR: NEGATIVE
SARS Coronavirus 2 by RT PCR: NEGATIVE

## 2022-12-22 LAB — ETHANOL: Alcohol, Ethyl (B): <10 mg/dL (ref <10)

## 2022-12-22 MED ORDER — ONDANSETRON 4 MG PO TBDP: 4.0000 mg | ORAL_TABLET | Freq: Three times a day (TID) | ORAL | 0 refills | Status: AC | PRN

## 2022-12-22 MED ORDER — AMOXICILLIN-POT CLAVULANATE 875-125 MG PO TABS: 1.0000 | ORAL_TABLET | Freq: Two times a day (BID) | ORAL | 0 refills | Status: DC

## 2022-12-22 MED ORDER — SODIUM CHLORIDE 0.9 % IV SOLN
25.0000 mg | INTRAVENOUS | Status: DC | PRN
  Filled 2022-12-22: qty 0.5

## 2022-12-22 MED ORDER — PREDNISONE 50 MG PO TABS
50.0000 mg | ORAL_TABLET | Freq: Once | ORAL | Status: AC
  Administered 2022-12-22: 50 mg via ORAL
  Filled 2022-12-22: qty 1

## 2022-12-22 MED ORDER — POTASSIUM CHLORIDE CRYS ER 20 MEQ PO TBCR
40.0000 meq | EXTENDED_RELEASE_TABLET | Freq: Once | ORAL | Status: AC
  Administered 2022-12-22: 40 meq via ORAL
  Filled 2022-12-22: qty 2

## 2022-12-22 MED ORDER — POTASSIUM CHLORIDE 10 MEQ/100ML IV SOLN
10.0000 meq | INTRAVENOUS | Status: AC
  Administered 2022-12-22 (×2): 10 meq via INTRAVENOUS
  Filled 2022-12-22 (×2): qty 100

## 2022-12-22 MED ORDER — FAMOTIDINE IN NACL 20-0.9 MG/50ML-% IV SOLN
20.0000 mg | Freq: Once | INTRAVENOUS | Status: AC
  Administered 2022-12-22: 20 mg via INTRAVENOUS
  Filled 2022-12-22: qty 50

## 2022-12-22 MED ORDER — ONDANSETRON HCL 4 MG/2ML IJ SOLN
4.0000 mg | Freq: Once | INTRAMUSCULAR | Status: AC
  Administered 2022-12-22: 4 mg via INTRAVENOUS
  Filled 2022-12-22: qty 2

## 2022-12-22 MED ORDER — POTASSIUM CHLORIDE CRYS ER 20 MEQ PO TBCR: 20.0000 meq | EXTENDED_RELEASE_TABLET | Freq: Every day | ORAL | 0 refills | Status: AC

## 2022-12-22 MED ORDER — LACTATED RINGERS IV BOLUS
1000.0000 mL | Freq: Once | INTRAVENOUS | Status: AC
  Administered 2022-12-22: 1000 mL via INTRAVENOUS

## 2022-12-22 NOTE — ED Notes (Signed)
Pt in imaging

## 2022-12-22 NOTE — ED Provider Notes (Signed)
[place] [facility] Provider Note   CSN: [id number] Arrival date & time: 12/22/22  1718     History {Add pertinent medical, surgical, social history, OB history to HPI:1} Chief Complaint  Patient presents with  . Nausea    Hailey Wolf is a 52 y.o. female with bipolar disorder, PTSD, GAD, obesity, insomnia who presents with nausea/vomiting.   Pt BIBA from street side- c/o n/v x3 weeks. Reports 2 emesis occurrences today. Also reports intermittent abd bloating but denies any abdominal pain.  Also denies chest pain, shortness of breath but does endorse some cough.  She endorses fevers and chills stating that she has been walking outside a lot because she does not currently have a car.  She is currently staying with a family member and is not staying at her home right now and she has been walking outside in the heat.  She states she has been sitting in the grass and knows that she has an allergy to grass and ragweed and notes that she has had some erythema on her legs as a result with itching.  The Benadryl helps the symptoms but does not take it totally away.  She denies any shortness of breath or wheezing.  However she does endorse intermittent lightheadedness.  She states she feels like she needs an EpiPen but has never been prescribed 1 or had 1 before.  States she has known allergies to eggs and milk.  Because she has been walking around outside a lot she has had to pour water on herself because she has been getting overheated.  Also states she is under a lot of stress recently because her daughter filed a restraining order against her and she believes that the lightheadedness could also be related to severe anxiety. She does smoke THC nearly daily but hasn't today, started back up recently d/t stress. Denies urinary symptoms, states she just had her period, denies vaginal symptoms or diarrhea/constipation.     HPI     Home Medications Prior to  Admission medications   Medication Sig Start Date End Date Taking? Authorizing Provider  citalopram (CELEXA) 40 MG tablet Take 1 tablet (40 mg total) by mouth daily. 11/26/17   Thresa Ross, MD  citalopram (CELEXA) 40 MG tablet Take by mouth. 09/24/17   [provider]  clonazePAM [name]) 0.5 MG tablet Take by mouth. 01/22/18 03/23/18  [provider]  divalproex (DEPAKOTE ER) 500 MG 24 hr tablet  01/22/18   [provider]  hydrOXYzine (ATARAX/VISTARIL) 10 MG tablet Take 1 tablet (10 mg total) by mouth 3 (three) times daily as needed. 11/26/17   Thresa Ross, MD  SUMAtriptan (IMITREX) 25 MG tablet Take 25 mg by mouth every 2 (two) hours as needed for migraine. May repeat in 2 hours if headache persists or recurs.    [provider]  traZODone (DESYREL) 100 MG tablet TAKE 1 TABLET(100 MG) BY MOUTH AT BEDTIME 01/03/18   Thresa Ross, MD  diphenhydrAMINE (BENADRYL) 25 mg capsule Take 25 mg by mouth at bedtime as needed for sleep.  05/31/17  [provider]  escitalopram (LEXAPRO) 20 MG tablet Take 1.5 tablets (30 mg total) by mouth daily. 08/16/17 09/24/17  Thresa Ross, MD  gabapentin (NEURONTIN) 300 MG capsule Take 300 mg by mouth. 05/21/17 05/31/17  [provider]      Allergies    Codeine, Fluoxetine, and Zoloft [sertraline hcl]    Review of Systems   Review of Systems  Review of systems Negative for CP, SOB.  A 10 point review of systems was performed and is negative unless otherwise reported in HPI.  Physical Exam Updated Vital Signs BP 109/71   Pulse 76   Temp 97.8 F (36.6 C) (Oral)   Resp 16   Ht 5\' 4"  (1.626 m)   Wt 97.5 kg   LMP 12/08/2022 (Approximate)   SpO2 100%   BMI 36.90 kg/m  Physical Exam General: Normal appearing female, lying in bed.  HEENT: PERRLA, Sclera anicteric, MMM, trachea midline.  Cardiology: RRR, no murmurs/rubs/gallops. BL radial and DP pulses equal bilaterally.  Resp: Normal respiratory rate and  effort. CTAB, no wheezes, rhonchi, crackles.  Abd: Soft, non-tender, non-distended. No rebound tenderness or guarding.  GU: Deferred. MSK: No peripheral edema or signs of trauma. Extremities without deformity or TTP. No cyanosis or clubbing. Skin: warm, dry. Scattered erythema on BL lower legs that is not raised. Blanching. Diffuse and nontender to palpation but pruritic to palpation.  Back: No CVA tenderness Neuro: A&Ox4, CNs II-XII grossly intact. MAEs. Sensation grossly intact.  Psych: Normal mood and affect.   ED Results / Procedures / Treatments   Labs (all labs ordered are listed, but only abnormal results are displayed) Labs Reviewed  CBC WITH DIFFERENTIAL/PLATELET - Abnormal; Notable for the following components:      Result Value   Hemoglobin 11.7 (*)    MCV 78.6 (*)    MCH 25.3 (*)    All other components within normal limits  COMPREHENSIVE METABOLIC PANEL - Abnormal; Notable for the following components:   Potassium 2.6 (*)    Calcium 8.5 (*)    Albumin 3.4 (*)    All other components within normal limits  URINALYSIS, ROUTINE W REFLEX MICROSCOPIC - Abnormal; Notable for the following components:   APPearance HAZY (*)    Hgb urine dipstick TRACE (*)    Bilirubin Urine SMALL (*)    Leukocytes,Ua TRACE (*)    All other components within normal limits  RAPID URINE DRUG SCREEN, HOSP PERFORMED - Abnormal; Notable for the following components:   Tetrahydrocannabinol POSITIVE (*)    All other components within normal limits  URINALYSIS, MICROSCOPIC (REFLEX) - Abnormal; Notable for the following components:   Bacteria, UA RARE (*)    All other components within normal limits  RESP PANEL BY RT-PCR (RSV, FLU A&B, COVID)  RVPGX2  LIPASE, BLOOD  ETHANOL  LACTIC ACID, PLASMA  PREGNANCY, URINE  CBG MONITORING, ED    EKG EKG Interpretation  Date/Time:  Friday Dec 22 2022 17:45:29 EDT Ventricular Rate:  72 PR Interval:  154 QRS Duration: 78 QT Interval:  441 QTC  Calculation: 483 R Axis:   65 Text Interpretation: Sinus rhythm Baseline wander in lead(s) V5 Confirmed by Vivi Barrack [phone]) on 12/22/2022 6:03:19 PM  Radiology No results found.  Procedures Procedures  {Document cardiac monitor, telemetry assessment procedure when appropriate:1}  Medications Ordered in ED Medications  lactated ringers bolus 1,000 mL (has no administration in time range)  potassium chloride 10 mEq in 100 mL IVPB (has no administration in time range)  diphenhydrAMINE (BENADRYL) 25 mg in sodium chloride 0.9 % 50 mL IVPB (has no administration in time range)  famotidine (PEPCID) IVPB 20 mg premix (has no administration in time range)  predniSONE (DELTASONE) tablet 50 mg (has no administration in time range)  ondansetron (ZOFRAN) injection 4 mg (4 mg Intravenous Given 12/22/22 1850)  potassium chloride SA (KLOR-CON M) CR tablet 40 mEq (40 mEq  Oral Given 12/22/22 1851)    ED Course/ Medical Decision Making/ A&P                          Medical Decision Making Amount and/or Complexity of Data Reviewed Labs: ordered. Decision-making details documented in ED Course. Radiology: ordered. Decision-making details documented in ED Course.  Risk Prescription drug management.    This patient presents to the ED for concern of N/V, this involves an extensive number of treatment options, and is a complaint that carries with it a high risk of complications and morbidity.  Overall patient is very well-appearing and HDS.  MDM:    For patient's constellation of symptoms, consider anaphylaxis given her report of erythema/pruritus on her legs, nausea/vomiting, cough, and lightheadedness. She states she has known allergens to milk/eggs/grass/ragweed and has had to sit in the grass, so known exposure to allergen. Erythematous rash on bilateral lower extremities appears to me to look like a sunburn rather than urticaria or contact dermatitis, as it is not raised in any way, however  patient states it has improved with benadryl so could have originally been raised like urticaria. Given report of exposure to known allergen will treat for allergic reaction. She has no hypotension or wheezing/hypoxia now to indicate a need for epinephrine. Will give pepcid/prednisone/benadryl. Will also give fluids d/t report of dehydration and zofran for nausea. Also consider gastroenteritis, cannabinoid hyperemesis, intermittent heat exhaustion though patient is afebrile here, electrolyte derangements/renal injury, or infectious process such as UTI though no reported urinary symptoms, or URI/bronchitis/pneumonia given report of cough.    Clinical Course as of 01/03/23 1601  Fri Dec 22, 2022  1802 WBC: 6.6 No leukocytosis  [HN]  1802 Hemoglobin(!): 11.7 [HN]  1802 Glucose-Capillary: 85 [HN]  1832 Potassium(!!): 2.6 Likely d/t GI losses and poor PO intake. Will replete orally and IV [HN]  1832 Lactic Acid, Venous: 1.1 [HN]  1832 Alcohol, Ethyl (B): <10 [HN]  1832 Lipase: 25 [HN]  1832 Glucose-Capillary: 85 [HN]  1853 Tetrahydrocannabinol(!): POSITIVE Otherwise neg UDS [HN]  2036 DG Chest 2 View FINDINGS: The heart size and mediastinal contours are within normal limits. Both lungs are clear. The visualized skeletal structures are unremarkable.  IMPRESSION: No acute abnormality of the lungs.   [HN]  2117 On reevaluation, patient is very sleepy and intermittently desatting into 80s while sleeping. She is difficult to arouse and with arousal she is oriented x4 with no FNDs but falls asleep immediately. Will draw VBG. [HN]  2201 pCO2, Ven: 46.2 No hypercarbia [HN]  2312 CT Head Wo Contrast 1. No acute intracranial pathology. 2. Marked severity left maxillary sinus and left-sided sphenoid sinus disease.   [HN]  2317 Patient came back from CT very awake and very upset.  She is wondered why she is still here.  She states that she was sleeping so hard that we must have given her something  to make her sleep.  I assured her that I have not given her anything, and in fact she refused the Benadryl that I had tried to give her for her lower extremity itching.  Patient extremely upset that security was called.  I discussed with the patient alone and she has trauma from at some point being IVC due to her bipolar disorder and she was afraid that she was going to be IVC and restrained.  She denied any suicidality or homicidality, she has not responded any internal stimuli or demonstrated any psychosis here  today.  I reassured patient that I have no reason at this time to chemically sedate or restrain her and that actually I was coming to discharge her from the emergency department.  She is very relieved.  I discussed with her that we will give her a Zofran prescription for nausea vomiting and that she has severely low potassium today which I will supplement with 20 mill equivalents every day for 3 days and patient reports understanding. Her CTH additionally shows sinus disease which could be contributing to her nausea/vomiting/fatigue/feverish feeling so will treat with augmentin x 7 days. Encouraged patient to f/u with her PCP within 1week for f/u and lab recheck of potassium and she reports understanding. DC w/ discharge instructions/return precautions. All questions answered to patient's satisfaction. [HN]    Clinical Course User Index [HN] Loetta Rough, MD    Labs: I Ordered, and personally interpreted labs.  The pertinent results include:  ***  Imaging Studies ordered: I ordered imaging studies including *** I independently visualized and interpreted imaging. I agree with the radiologist interpretation  Additional history obtained from ***.  External records from outside source obtained and reviewed including ***  Cardiac Monitoring: .The patient was maintained on a cardiac monitor.  I personally viewed and interpreted the cardiac monitored which showed an underlying rhythm of:  ***  Reevaluation: After the interventions noted above, I reevaluated the patient and found that they have :{resolved/improved/worsened:23923::"improved"}  Social Determinants of Health: .***  Disposition:  ***  Co morbidities that complicate the patient evaluation . Past Medical History:  Diagnosis Date  . Anxiety      Medicines Meds ordered this encounter  Medications  . lactated ringers bolus 1,000 mL  . ondansetron (ZOFRAN) injection 4 mg  . potassium chloride 10 mEq in 100 mL IVPB  . potassium chloride SA (KLOR-CON M) CR tablet 40 mEq  . diphenhydrAMINE (BENADRYL) 25 mg in sodium chloride 0.9 % 50 mL IVPB  . famotidine (PEPCID) IVPB 20 mg premix  . predniSONE (DELTASONE) tablet 50 mg    I have reviewed the patients home medicines and have made adjustments as needed  Problem List / ED Course: Problem List Items Addressed This Visit   None        {Document critical care time when appropriate:1} {Document review of labs and clinical decision tools ie heart score, Chads2Vasc2 etc:1}  {Document your independent review of radiology images, and any outside records:1} {Document your discussion with family members, caretakers, and with consultants:1} {Document social determinants of health affecting pt's care:1} {Document your decision making why or why not admission, treatments were needed:1}  This note was created using dictation software, which may contain spelling or grammatical errors.

## 2022-12-22 NOTE — ED Triage Notes (Signed)
[facility]- c/o n/v x3 weeks. Reports 2 emesis occurrences today. Also reports intermittent abd bloating.   Also reports syncopal episodes due to "bipolar attacks."   126/84 90 HR 98% RA 98 CBG

## 2022-12-22 NOTE — Discharge Instructions (Addendum)
Thank you for coming to [facility] Emergency Department. You were seen for nausea/vomiting. We did an exam, labs, and imaging, and these showed no acute findings.  We have prescribed Zofran to use 4 mg under the tongue every 6-8 hours as needed for nausea vomiting to ensure that you can eat and stay well-hydrated.  Your potassium was very low here and we have also prescribed 1 tablet of potassium to take once per day for 3 days.  Please follow-up with your primary physician within 1 week for a follow up and lab recheck.  The CT we did of your head also showed sinusitis, which could be contributing to your symptoms. Please take augmentin twice per day for 7 days.  Please follow up with your primary care provider within 1 week.   Do not hesitate to return to the ED or call 911 if you experience: -Worsening symptoms -Abdominal pain -Severe nausea/vomiting that you cannot eat/drink anything -Lightheadedness, passing out -Fevers/chills -Anything else that concerns you

## 2022-12-22 NOTE — ED Notes (Addendum)
Pt was cussing and belligerent, asking where she was at, she asked about [facility] or [address], I explained EMS dropped her off at [address], she had no recollection of the details about how she arrived.  I explained that EMS brought her here at the her (pt's) request.  Pt has been very somnolent for the last five hours, but woke up cussing and yelling at staff. EDP spoke with patient, but agreed the patient can go.

## 2022-12-22 NOTE — ED Notes (Signed)
Pt reports her shirt is soaking wet bc she has been pouring water all over herself today to stay cool.  Pt provided with disposable scrub top, no further needs expressed. Call bell within reach, will continue to monitor.

## 2022-12-22 NOTE — ED Notes (Signed)
RN called to room due to pt demanding to leave, claiming she does not know where she is "yall have been giving me combinations."   EDP Jearld Fenton made aware, to bedside to speak with pt.   Pt with escalating verbal aggression, profanity towards staff, accusing staff of drugging her.  Security at bedside, PD at bedside.

## 2022-12-22 NOTE — ED Notes (Signed)
Pt d/c by charge RN, escorted out of ED by security.

## 2022-12-28 ENCOUNTER — Emergency Department (HOSPITAL_BASED_OUTPATIENT_CLINIC_OR_DEPARTMENT_OTHER)
Admission: EM | Admit: 2022-12-28 | Discharge: 2022-12-28 | Discharge status: Against Medical Advice | Payer: 59 | Attending: Emergency Medicine | Admitting: Emergency Medicine

## 2022-12-28 ENCOUNTER — Encounter (HOSPITAL_BASED_OUTPATIENT_CLINIC_OR_DEPARTMENT_OTHER): Payer: Self-pay

## 2022-12-28 ENCOUNTER — Other Ambulatory Visit (HOSPITAL_BASED_OUTPATIENT_CLINIC_OR_DEPARTMENT_OTHER): Payer: Self-pay

## 2022-12-28 DIAGNOSIS — Z5329 Procedure and treatment not carried out because of patient's decision for other reasons: Secondary | ICD-10-CM | POA: Diagnosis not present

## 2022-12-28 DIAGNOSIS — R531 Weakness: Secondary | ICD-10-CM | POA: Diagnosis not present

## 2022-12-28 MED ORDER — AMOXICILLIN-POT CLAVULANATE 875-125 MG PO TABS
1.0000 | ORAL_TABLET | Freq: Two times a day (BID) | ORAL | 0 refills | Status: AC
  Filled 2022-12-28: qty 14, 7d supply, fill #0

## 2022-12-28 NOTE — ED Notes (Signed)
Patient leaving AMA, does not want testing done. Provided with a few snacks and cab. Provider discussed risks/benefits of leaving with patient. Alert, oriented and ambulatory at time of departure.

## 2022-12-28 NOTE — ED Notes (Signed)
Pt. Reports she is just here because she needs water.  Pt. Was assessed completely by PA C Blue.  Pt. Demanded she did not want blood work and she only wanted Water and noting else.  Pt. Said she needed to sleep and be left alone.  RN at bedside of the Pt. During the exam.  Pt. Neuro exam is WNL.  Pt. Is very demanding about her wants and needs.  Pt. Was seen on Sunday she reports and had a work up done.

## 2022-12-28 NOTE — ED Triage Notes (Signed)
BIB EMS w/ c/o headaches, generalized weakness & dizziness. Recently diagnosed with UTI on 5/28. Hasn't been taking abx.

## 2022-12-28 NOTE — ED Provider Notes (Signed)
[place] [facility] Provider Note   CSN: [id number] Arrival date & time: 12/28/22  1119     History  Chief Complaint  Hailey Wolf presents with   Weakness    Hailey Wolf is a 52 y.o. female with a past medical history of bipolar 1 disorder, migraines, who presents emergency department brought in by EMS with concerns for generalized weakness onset PTA.  Hailey Wolf reports that they were recently diagnosed with a UTI on 5/28 however they have not been taking antibiotics. Has associated nausea, lower abdominal pain, dizziness. Denies chest pain, shortness of breath, fever, vomiting.    Per Hailey Wolf chart review: Hailey Wolf was evaluated at [facility] medical center on 12/26/2022 due to syncope, headache.  Hailey Wolf was discharged home with a prescription for Augmentin, Flonase, Imitrex.  Hailey Wolf was evaluated in the emergency department on 12/24/2022 and at that time was told that her potassium was low.  At that time Hailey Wolf was sent a prescription for Augmentin.  The history is provided by the Hailey Wolf. No language interpreter was used.       Home Medications Prior to Admission medications   Medication Sig Start Date End Date Taking? Authorizing Provider  amoxicillin-clavulanate (AUGMENTIN) 875-125 MG tablet Take 1 tablet by mouth every 12 (twelve) hours. 12/28/22   Anaiyah Anglemyer A, PA-C  citalopram (CELEXA) 40 MG tablet Take 1 tablet (40 mg total) by mouth daily. 11/26/17   Thresa Ross, MD  citalopram (CELEXA) 40 MG tablet Take by mouth. 09/24/17   [provider]  clonazePAM [name]) 0.5 MG tablet Take by mouth. 01/22/18 03/23/18  [provider]  divalproex (DEPAKOTE ER) 500 MG 24 hr tablet  01/22/18   [provider]  hydrOXYzine (ATARAX/VISTARIL) 10 MG tablet Take 1 tablet (10 mg total) by mouth 3 (three) times daily as needed. 11/26/17   Thresa Ross, MD  ondansetron (ZOFRAN-ODT) 4 MG disintegrating tablet Take 1 tablet (4  mg total) by mouth every 8 (eight) hours as needed for nausea or vomiting. 12/22/22   Loetta Rough, MD  potassium chloride SA (KLOR-CON M) 20 MEQ tablet Take 1 tablet (20 mEq total) by mouth daily for 3 days. 12/22/22 12/25/22  Loetta Rough, MD  SUMAtriptan (IMITREX) 25 MG tablet Take 25 mg by mouth every 2 (two) hours as needed for migraine. May repeat in 2 hours if headache persists or recurs.    [provider]  traZODone (DESYREL) 100 MG tablet TAKE 1 TABLET(100 MG) BY MOUTH AT BEDTIME 01/03/18   Thresa Ross, MD  diphenhydrAMINE (BENADRYL) 25 mg capsule Take 25 mg by mouth at bedtime as needed for sleep.  05/31/17  [provider]  escitalopram (LEXAPRO) 20 MG tablet Take 1.5 tablets (30 mg total) by mouth daily. 08/16/17 09/24/17  Thresa Ross, MD  gabapentin (NEURONTIN) 300 MG capsule Take 300 mg by mouth. 05/21/17 05/31/17  [provider]      Allergies    Codeine, Fluoxetine, and Zoloft [sertraline hcl]    Review of Systems   Review of Systems  All other systems reviewed and are negative.   Physical Exam Updated Vital Signs BP 113/62 (BP Location: Right Arm)   Pulse 72   Temp 98.3 F (36.8 C) (Oral)   Resp 18   Ht 5\' 4"  (1.626 m)   Wt 97.5 kg   LMP 12/08/2022 (Approximate)   SpO2 99%   BMI 36.90 kg/m  Physical Exam Vitals and nursing note reviewed.  Constitutional:  General: Hailey Wolf is not in acute distress.    Appearance: Hailey Wolf is not diaphoretic.  HENT:     Head: Normocephalic and atraumatic.     Mouth/Throat:     Pharynx: No oropharyngeal exudate.  Eyes:     General: No scleral icterus.    Conjunctiva/sclera: Conjunctivae normal.  Cardiovascular:     Rate and Rhythm: Normal rate and regular rhythm.     Pulses: Normal pulses.     Heart sounds: Normal heart sounds.  Pulmonary:     Effort: Pulmonary effort is normal. No respiratory distress.     Breath sounds: Normal breath sounds. No wheezing.  Abdominal:     General: Bowel sounds  are normal.     Palpations: Abdomen is soft. There is no mass.     Tenderness: There is no abdominal tenderness. There is no guarding or rebound.     Comments: No appreciable abdominal TTP  Musculoskeletal:        General: Normal range of motion.     Cervical back: Normal range of motion and neck supple.  Skin:    General: Skin is warm and dry.  Neurological:     Mental Status: Hailey Wolf is alert.     Comments: Negative pronator drift. Grip strength 5/5 bilaterally. Strength and sensation intact to BUE and BLE.  Psychiatric:        Behavior: Behavior normal.     ED Results / Procedures / Treatments   Labs (all labs ordered are listed, but only abnormal results are displayed) Labs Reviewed - No data to display  EKG None  Radiology No results found.  Procedures Procedures    Medications Ordered in ED Medications - No data to display  ED Course/ Medical Decision Making/ A&P Clinical Course as of 12/28/22 1303  Thu Dec 28, 2022  1135 Hailey Wolf evaluated at bedside and Hailey Wolf noted that Hailey Wolf only wants water and to be left alone.  Discussed with Hailey Wolf that Hailey Wolf has symptoms consistent of abdominal pain, dizziness and that would warrant [place] checking blood.  Hailey Wolf then notes "already know my potassium is low I know of UTI have been able to pick up any medications and I do not want any blood work at this time."  Again reiterated to Hailey Wolf with RN at bedside importance of lab workup to rule out any acute findings in the setting.  Hailey Wolf again adamantly declines and notes "you are getting on my nerves I does want some water I do not want blood work done of already had that done." [SB]  1142 Discussed with Hailey Wolf again regarding plans for lab work up due to Hailey Wolf with continued symptoms. Hailey Wolf again declines labs or imaging. Hailey Wolf notes that Hailey Wolf would like to get saltines, peanut butter, and a cab voucher to leave. Again reiterated with Hailey Wolf importance of [place] obtaining labs due to Hailey Wolf's  continued symptoms.  Hailey Wolf declines and notes "I do not want to get any blood work done because I know what is going on and I do not want to waste y'all's time and resources.  I would like my saltine crackers, peanut butter and to go."  Discussed with Hailey Wolf risk of leaving AMA.  Hailey Wolf aware and verbalized understanding. [SB]    Clinical Course User Index [SB] Ogle Hoeffner A, PA-C                             Medical Decision Making Risk Prescription drug management.  Hailey Wolf presents with concerns for generalized weakness onset PTA. Vital signs, Hailey Wolf afebrile. On exam, Hailey Wolf with no acute cardiovascular, respiratory, abdominal exam findings. Differential diagnosis includes hypoglycemia, arrhythmia, anemia, electrolyte abnormality, acute cystitis.    Co morbidities that complicate the Hailey Wolf evaluation: Bipolar 1 disorder Migraines   Additional history obtained:  External records from outside source obtained and reviewed including: Hailey Wolf was evaluated at [facility] medical center on 12/26/2022 due to syncope, headache.  Hailey Wolf was discharged home with a prescription for Augmentin, Flonase, Imitrex.  Hailey Wolf was evaluated in the emergency department on 12/24/2022 and at that time was told that her potassium was low.  At that time Hailey Wolf was sent a prescription for Augmentin.   Disposition: Presentation suspicious for generalized weakness. Thorough discussion held with Hailey Wolf with RN present regarding treatment plan consistent of lab work to which Hailey Wolf adamantly refuses at this time. See ED course for remainder of discussion.  Hailey Wolf to leave AMA at this time due to Hailey Wolf declining further workup in the emergency department.  Discussed with Hailey Wolf in detail and at length regarding plans for AMA and that if there was any acute changes we would not be able to determine that if Hailey Wolf will not allow [place] to treat her today and obtaining lab and imaging if needed.  Hailey Wolf acknowledges an  understanding and notes that Hailey Wolf just wants water, saltines, peanut butter crackers.  See ED course for remainder of discussion. Hailey Wolf requesting her Augmentin be sent to the pharmacy in this building. Augmentin sent to pharmacy.    This chart was dictated using voice recognition software, Dragon. Despite the best efforts of this provider to proofread and correct errors, errors may still occur which can change documentation meaning.   Final Clinical Impression(s) / ED Diagnoses Final diagnoses:  Generalized weakness    Rx / DC Orders ED Discharge Orders          Ordered    amoxicillin-clavulanate (AUGMENTIN) 875-125 MG tablet  Every 12 hours        12/28/22 1142              Mischa Brittingham A, PA-C 12/28/22 1305    Linwood Dibbles, MD 12/28/22 1557

## 2023-01-10 ENCOUNTER — Other Ambulatory Visit (HOSPITAL_BASED_OUTPATIENT_CLINIC_OR_DEPARTMENT_OTHER): Payer: Self-pay

## 2023-01-13 ENCOUNTER — Encounter (HOSPITAL_BASED_OUTPATIENT_CLINIC_OR_DEPARTMENT_OTHER): Payer: Self-pay | Admitting: Emergency Medicine

## 2023-01-13 ENCOUNTER — Emergency Department (HOSPITAL_BASED_OUTPATIENT_CLINIC_OR_DEPARTMENT_OTHER)
Admission: EM | Admit: 2023-01-13 | Discharge: 2023-01-13 | Disposition: A | Discharge status: Home / Self Care | Payer: 59 | Attending: Emergency Medicine | Admitting: Emergency Medicine

## 2023-01-13 ENCOUNTER — Other Ambulatory Visit: Payer: Self-pay

## 2023-01-13 DIAGNOSIS — F410 Panic disorder [episodic paroxysmal anxiety] without agoraphobia: Secondary | ICD-10-CM

## 2023-01-13 HISTORY — DX: Bipolar disorder, unspecified: F31.9

## 2023-01-13 NOTE — ED Triage Notes (Addendum)
Pt has history of anxiety.  Has been very anxious today.  Pt had syncopal episode today after taking her fathers amiodarone.  Pt continues to have dizziness and anxiety.  Pt is currently alert and oriented x 4.  NAD noted.

## 2023-01-13 NOTE — ED Notes (Signed)
IV removed. Skin intact.

## 2023-01-13 NOTE — ED Provider Notes (Signed)
[facility] Provider Note   CSN: [id number] Arrival date & time: 01/13/23  1732     History  Chief Complaint  Patient presents with   Anxiety    Hailey Wolf is a 52 y.o. female history of bipolar, anxiety here presenting with panic attack.  Patient states that she has been anxious all day.  He states that she has some palpitations.  She states that her father gave her a dose of his amiodarone.  She is feeling much better now.  She had previous psych admissions but denies suicidal or homicidal ideations  The history is provided by the patient.       Home Medications Prior to Admission medications   Medication Sig Start Date End Date Taking? Authorizing Provider  amoxicillin-clavulanate (AUGMENTIN) 875-125 MG tablet Take 1 tablet by mouth every 12 (twelve) hours. 12/28/22   Blue, Soijett A, PA-C  citalopram (CELEXA) 40 MG tablet Take 1 tablet (40 mg total) by mouth daily. 11/26/17   Thresa Ross, MD  citalopram (CELEXA) 40 MG tablet Take by mouth. 09/24/17   [provider]  clonazePAM [name]) 0.5 MG tablet Take by mouth. 01/22/18 03/23/18  [provider]  divalproex (DEPAKOTE ER) 500 MG 24 hr tablet  01/22/18   [provider]  hydrOXYzine (ATARAX/VISTARIL) 10 MG tablet Take 1 tablet (10 mg total) by mouth 3 (three) times daily as needed. 11/26/17   Thresa Ross, MD  ondansetron (ZOFRAN-ODT) 4 MG disintegrating tablet Take 1 tablet (4 mg total) by mouth every 8 (eight) hours as needed for nausea or vomiting. 12/22/22   Loetta Rough, MD  potassium chloride SA (KLOR-CON M) 20 MEQ tablet Take 1 tablet (20 mEq total) by mouth daily for 3 days. 12/22/22 12/25/22  Loetta Rough, MD  SUMAtriptan (IMITREX) 25 MG tablet Take 25 mg by mouth every 2 (two) hours as needed for migraine. May repeat in 2 hours if headache persists or recurs.    [provider]  traZODone (DESYREL) 100 MG tablet TAKE 1  TABLET(100 MG) BY MOUTH AT BEDTIME 01/03/18   Thresa Ross, MD  diphenhydrAMINE (BENADRYL) 25 mg capsule Take 25 mg by mouth at bedtime as needed for sleep.  05/31/17  [provider]  escitalopram (LEXAPRO) 20 MG tablet Take 1.5 tablets (30 mg total) by mouth daily. 08/16/17 09/24/17  Thresa Ross, MD  gabapentin (NEURONTIN) 300 MG capsule Take 300 mg by mouth. 05/21/17 05/31/17  [provider]      Allergies    Codeine, Fluoxetine, and Zoloft [sertraline hcl]    Review of Systems   Review of Systems  Psychiatric/Behavioral:  The patient is nervous/anxious.   All other systems reviewed and are negative.   Physical Exam Updated Vital Signs BP 112/68 (BP Location: Right Arm)   Pulse 79   Temp 98.2 F (36.8 C) (Oral)   Resp 20   Ht 5\' 4"  (1.626 m)   Wt 94.3 kg   LMP 12/08/2022 (Approximate)   SpO2 99%   BMI 35.70 kg/m  Physical Exam Vitals and nursing note reviewed.  Constitutional:      Comments: Calm  HENT:     Head: Normocephalic.     Nose: Nose normal.     Mouth/Throat:     Mouth: Mucous membranes are moist.  Eyes:     Extraocular Movements: Extraocular movements intact.     Pupils: Pupils are equal, round, and reactive to light.  Cardiovascular:  Rate and Rhythm: Normal rate and regular rhythm.     Pulses: Normal pulses.     Heart sounds: Normal heart sounds.  Pulmonary:     Effort: Pulmonary effort is normal.     Breath sounds: Normal breath sounds.  Abdominal:     General: Abdomen is flat.     Palpations: Abdomen is soft.  Musculoskeletal:        General: Normal range of motion.     Cervical back: Normal range of motion.  Skin:    General: Skin is warm.  Neurological:     General: No focal deficit present.     Mental Status: She is alert.  Psychiatric:        Mood and Affect: Mood normal.     ED Results / Procedures / Treatments   Labs (all labs ordered are listed, but only abnormal results are displayed) Labs Reviewed - No  data to display  EKG None  Radiology No results found.  Procedures Procedures    Medications Ordered in ED Medications - No data to display  ED Course/ Medical Decision Making/ A&P                             Medical Decision Making Hailey Wolf is a 52 y.o. female history of bipolar here presenting with panic attack.  Patient is now calm.  Her heart rate is normal.  Patient denies any suicidal homicidal ideation.  I told her that she can follow-up with her psychiatrist.   Problems Addressed: Panic attack: acute illness or injury    Final Clinical Impression(s) / ED Diagnoses Final diagnoses:  None    Rx / DC Orders ED Discharge Orders     None         Charlynne Pander, MD 01/13/23 [name]

## 2023-01-13 NOTE — ED Notes (Signed)
Pt reports "feeling a lot better now".  States she is "not having any symptoms now".

## 2023-01-13 NOTE — Discharge Instructions (Addendum)
Please continue taking your medications as prescribed by your doctor  You need to follow-up with your psychiatrist  Return to ER if you have worse anxiety, panic attacks, palpitations, thoughts of harming yourself or others

## 2023-04-11 ENCOUNTER — Emergency Department (HOSPITAL_BASED_OUTPATIENT_CLINIC_OR_DEPARTMENT_OTHER): Payer: 59

## 2023-04-11 ENCOUNTER — Encounter (HOSPITAL_BASED_OUTPATIENT_CLINIC_OR_DEPARTMENT_OTHER): Payer: Self-pay

## 2023-04-11 ENCOUNTER — Other Ambulatory Visit: Payer: Self-pay

## 2023-04-11 ENCOUNTER — Emergency Department (HOSPITAL_BASED_OUTPATIENT_CLINIC_OR_DEPARTMENT_OTHER)
Admission: EM | Admit: 2023-04-11 | Discharge: 2023-04-11 | Disposition: A | Discharge status: Home / Self Care | Payer: 59 | Attending: Emergency Medicine | Admitting: Emergency Medicine

## 2023-04-11 DIAGNOSIS — G4700 Insomnia, unspecified: Secondary | ICD-10-CM | POA: Insufficient documentation

## 2023-04-11 DIAGNOSIS — F419 Anxiety disorder, unspecified: Secondary | ICD-10-CM | POA: Diagnosis not present

## 2023-04-11 LAB — CBC WITH DIFFERENTIAL/PLATELET
Abs Immature Granulocytes: 0.03 10*3/uL (ref 0.00–0.07)
Basophils Absolute: 0.1 10*3/uL (ref 0.0–0.1)
Basophils Relative: 1 %
Eosinophils Absolute: 0.1 10*3/uL (ref 0.0–0.5)
Eosinophils Relative: 1 %
HCT: 42.2 % (ref 36.0–46.0)
Hemoglobin: 14.2 g/dL (ref 12.0–15.0)
Immature Granulocytes: 0 %
Lymphocytes Relative: 29 %
Lymphs Abs: 2.6 10*3/uL (ref 0.7–4.0)
MCH: 26.7 pg (ref 26.0–34.0)
MCHC: 33.6 g/dL (ref 30.0–36.0)
MCV: 79.3 fL — ABNORMAL LOW (ref 80.0–100.0)
Monocytes Absolute: 0.5 10*3/uL (ref 0.1–1.0)
Monocytes Relative: 5 %
Neutro Abs: 5.8 10*3/uL (ref 1.7–7.7)
Neutrophils Relative %: 64 %
Platelets: 291 10*3/uL (ref 150–400)
RBC: 5.32 MIL/uL — ABNORMAL HIGH (ref 3.87–5.11)
RDW: 14.4 % (ref 11.5–15.5)
WBC: 9 10*3/uL (ref 4.0–10.5)
nRBC: 0 % (ref 0.0–0.2)

## 2023-04-11 LAB — LITHIUM LEVEL: Lithium Lvl: 0.3 mmol/L — ABNORMAL LOW (ref 0.60–1.20)

## 2023-04-11 LAB — URINALYSIS, ROUTINE W REFLEX MICROSCOPIC
Bilirubin Urine: NEGATIVE
Glucose, UA: NEGATIVE mg/dL
Ketones, ur: 40 mg/dL — AB
Leukocytes,Ua: NEGATIVE
Nitrite: NEGATIVE
Protein, ur: NEGATIVE mg/dL
Specific Gravity, Urine: >=1.03 (ref 1.005–1.030)
pH: 5.5 (ref 5.0–8.0)

## 2023-04-11 LAB — TROPONIN I (HIGH SENSITIVITY): Troponin I (High Sensitivity): <2 ng/L (ref <18)

## 2023-04-11 LAB — COMPREHENSIVE METABOLIC PANEL
ALT: 9 U/L (ref 0–44)
AST: 10 U/L — ABNORMAL LOW (ref 15–41)
Albumin: 4.4 g/dL (ref 3.5–5.0)
Alkaline Phosphatase: 66 U/L (ref 38–126)
Anion gap: 10 (ref 5–15)
BUN: 6 mg/dL (ref 6–20)
CO2: 24 mmol/L (ref 22–32)
Calcium: 9.3 mg/dL (ref 8.9–10.3)
Chloride: 100 mmol/L (ref 98–111)
Creatinine, Ser: 0.63 mg/dL (ref 0.44–1.00)
GFR, Estimated: >60 mL/min (ref >60)
Glucose, Bld: 88 mg/dL (ref 70–99)
Potassium: 4 mmol/L (ref 3.5–5.1)
Sodium: 134 mmol/L — ABNORMAL LOW (ref 135–145)
Total Bilirubin: 0.8 mg/dL (ref 0.3–1.2)
Total Protein: 7.8 g/dL (ref 6.5–8.1)

## 2023-04-11 LAB — T4, FREE: Free T4: 0.9 ng/dL (ref 0.61–1.12)

## 2023-04-11 LAB — URINALYSIS, MICROSCOPIC (REFLEX)

## 2023-04-11 LAB — TSH: TSH: 1.087 u[IU]/mL (ref 0.350–4.500)

## 2023-04-11 MED ORDER — DIPHENHYDRAMINE HCL 50 MG/ML IJ SOLN
12.5000 mg | Freq: Once | INTRAMUSCULAR | Status: AC
  Administered 2023-04-11: 12.5 mg via INTRAVENOUS
  Filled 2023-04-11: qty 1

## 2023-04-11 MED ORDER — METOCLOPRAMIDE HCL 5 MG/ML IJ SOLN
10.0000 mg | Freq: Once | INTRAMUSCULAR | Status: AC
  Administered 2023-04-11: 10 mg via INTRAVENOUS
  Filled 2023-04-11: qty 2

## 2023-04-11 MED ORDER — SODIUM CHLORIDE 0.9 % IV BOLUS
1000.0000 mL | Freq: Once | INTRAVENOUS | Status: AC
  Administered 2023-04-11: 1000 mL via INTRAVENOUS

## 2023-04-11 NOTE — ED Triage Notes (Signed)
Pt presents to ED from home C/O unable to sleep X 5 days. Pt reports feeling jittery, not like herself. Denies pain.

## 2023-04-11 NOTE — ED Notes (Signed)
Pt. Reports she came in due to inablility to sleep and settle down.  Pt. Feels anxious.  Pt. Reports no sleep for 5 days now.

## 2023-04-11 NOTE — Discharge Instructions (Signed)
Your thyroid panel as well as lithium panel will not be back today.  He will need to follow-up with your primary care doctor in regards to this.  Return if you feel like your symptoms are worsening.  I am suspicious that this may be related to your anxiety/bipolar disorder.  I recommend that you follow-up with your psychiatrist, for further evaluation as well as your primary care.

## 2023-04-11 NOTE — ED Provider Notes (Signed)
[place] [facility] Provider Note   CSN: [id number] Arrival date & time: 04/11/23  1205     History  Chief Complaint  Patient presents with   Insomnia    Hailey Wolf is a 52 y.o. female, history of bipolar disorder, who presents to the ED secondary to inability to sleep for the last 5 days.  She feels very jittery, and not well.  She states she has intermittent episodes of chest pain, but this shortness of breath, but does not have any current chest pain or shortness of breath.  She states she does not know if this is secondary to her anxiety.  She denies any kind of nausea, vomiting, abdominal pain.  Notes that she just cannot sleep.  Has been compliant with her psychiatric medications, she states she is on lithium, an antidepressant, and then another medication she cannot remember.  Is not taking Klonopin, or Atarax anymore, she states these were not helpful for her.  Denies any recent medication changes except for the medication cannot remember, was recently increased by her psychiatrist.  She called her psychiatrist today and they told her to come to the hospital.  She denies any SI, HI.     Home Medications Prior to Admission medications   Medication Sig Start Date End Date Taking? Authorizing Provider  amoxicillin-clavulanate (AUGMENTIN) 875-125 MG tablet Take 1 tablet by mouth every 12 (twelve) hours. 12/28/22   Blue, Soijett A, PA-C  citalopram (CELEXA) 40 MG tablet Take 1 tablet (40 mg total) by mouth daily. 11/26/17   Thresa Ross, MD  citalopram (CELEXA) 40 MG tablet Take by mouth. 09/24/17   [provider]  clonazePAM [name]) 0.5 MG tablet Take by mouth. 01/22/18 03/23/18  [provider]  divalproex (DEPAKOTE ER) 500 MG 24 hr tablet  01/22/18   [provider]  hydrOXYzine (ATARAX/VISTARIL) 10 MG tablet Take 1 tablet (10 mg total) by mouth 3 (three) times daily as needed. 11/26/17   Thresa Ross, MD   ondansetron (ZOFRAN-ODT) 4 MG disintegrating tablet Take 1 tablet (4 mg total) by mouth every 8 (eight) hours as needed for nausea or vomiting. 12/22/22   Loetta Rough, MD  potassium chloride SA (KLOR-CON M) 20 MEQ tablet Take 1 tablet (20 mEq total) by mouth daily for 3 days. 12/22/22 12/25/22  Loetta Rough, MD  SUMAtriptan (IMITREX) 25 MG tablet Take 25 mg by mouth every 2 (two) hours as needed for migraine. May repeat in 2 hours if headache persists or recurs.    [provider]  traZODone (DESYREL) 100 MG tablet TAKE 1 TABLET(100 MG) BY MOUTH AT BEDTIME 01/03/18   Thresa Ross, MD  diphenhydrAMINE (BENADRYL) 25 mg capsule Take 25 mg by mouth at bedtime as needed for sleep.  05/31/17  [provider]  escitalopram (LEXAPRO) 20 MG tablet Take 1.5 tablets (30 mg total) by mouth daily. 08/16/17 09/24/17  Thresa Ross, MD  gabapentin (NEURONTIN) 300 MG capsule Take 300 mg by mouth. 05/21/17 05/31/17  [provider]      Allergies    Codeine, Fluoxetine, and Zoloft [sertraline hcl]    Review of Systems   Review of Systems  Psychiatric/Behavioral:  Negative for hallucinations. The patient is nervous/anxious and has insomnia.     Physical Exam Updated Vital Signs BP 118/63 (BP Location: Right Arm)   Pulse 69   Temp 97.8 F (36.6 C) (Oral)   Resp 18   Ht 5\' 4"  (1.626 m)  Wt 86.2 kg   LMP 12/08/2022 (Approximate)   SpO2 100%   BMI 32.61 kg/m  Physical Exam Vitals and nursing note reviewed.  Constitutional:      General: She is not in acute distress.    Appearance: She is well-developed.  HENT:     Head: Normocephalic and atraumatic.  Eyes:     Conjunctiva/sclera: Conjunctivae normal.  Cardiovascular:     Rate and Rhythm: Normal rate and regular rhythm.     Heart sounds: No murmur heard. Pulmonary:     Effort: Pulmonary effort is normal. No respiratory distress.     Breath sounds: Normal breath sounds.  Abdominal:     Palpations: Abdomen is soft.      Tenderness: There is no abdominal tenderness.  Musculoskeletal:        General: No swelling.     Cervical back: Neck supple.  Skin:    General: Skin is warm and dry.     Capillary Refill: Capillary refill takes less than 2 seconds.  Neurological:     Mental Status: She is alert.  Psychiatric:        Mood and Affect: Mood normal.     ED Results / Procedures / Treatments   Labs (all labs ordered are listed, but only abnormal results are displayed) Labs Reviewed  CBC WITH DIFFERENTIAL/PLATELET - Abnormal; Notable for the following components:      Result Value   RBC 5.32 (*)    MCV 79.3 (*)    All other components within normal limits  COMPREHENSIVE METABOLIC PANEL - Abnormal; Notable for the following components:   Sodium 134 (*)    AST 10 (*)    All other components within normal limits  URINALYSIS, ROUTINE W REFLEX MICROSCOPIC - Abnormal; Notable for the following components:   Hgb urine dipstick Hailey Wolf (*)    Ketones, ur 40 (*)    All other components within normal limits  URINALYSIS, MICROSCOPIC (REFLEX) - Abnormal; Notable for the following components:   Bacteria, UA FEW (*)    All other components within normal limits  TSH  T4, FREE  LITHIUM LEVEL  TROPONIN I (HIGH SENSITIVITY)  TROPONIN I (HIGH SENSITIVITY)    EKG EKG Interpretation Date/Time:  Wednesday April 11 2023 14:29:40 EDT Ventricular Rate:  72 PR Interval:  152 QRS Duration:  87 QT Interval:  418 QTC Calculation: 458 R Axis:   46  Text Interpretation: Sinus rhythm Borderline T wave abnormalities Confirmed by Glyn Ade [phone]) on 04/11/2023 2:54:00 PM  Radiology DG Chest 2 View  Result Date: 04/11/2023 CLINICAL DATA:  Chest pain. EXAM: CHEST - 2 VIEW COMPARISON:  Dec 22, 2022. FINDINGS: The heart size and mediastinal contours are within normal limits. Both lungs are clear. The visualized skeletal structures are unremarkable. IMPRESSION: No active cardiopulmonary disease.  Electronically Signed   By: Lupita Raider M.D.   On: 04/11/2023 15:26    Procedures Procedures    Medications Ordered in ED Medications  sodium chloride 0.9 % bolus 1,000 mL ( Intravenous Stopped 04/11/23 1632)  metoCLOPramide (REGLAN) injection 10 mg (10 mg Intravenous Given 04/11/23 1530)  diphenhydrAMINE (BENADRYL) injection 12.5 mg (12.5 mg Intravenous Given 04/11/23 1532)    ED Course/ Medical Decision Making/ A&P                                 Medical Decision Making Patient is 52 year old female, here for increased anxiety,  difficulty sleeping for the last 5 days.  She states that she just feels like something is off, went to be checked out.  She reports intermittent chest pain, but she thinks that may be secondary to her anxiety.  Denies any shortness of breath.  Obtain blood work, as well as troponin chest x-ray EKG for further evaluation.  She is well-appearing on exam, denies any current chest pain.  Recently saw her psychiatrist, no med changes recently.  Has been compliant with meds.  Denies SI, HI  Amount and/or Complexity of Data Reviewed Labs: ordered.    Details: No electrolyte abnormalities, troponin within normal limits Radiology: ordered.    Details: Chest x-ray clear ECG/medicine tests:  Decision-making details documented in ED Course. Discussion of management or test interpretation with external provider(s): Discussed with patient, blood work is reassuring, TSH and lithium level pending, will not be back for the next day as they had to be sent to [facility].  I informed patient of this, she will follow-up on this.    I am suspicious this may be secondary to her anxiety,/panic disorder, and that she will need to see her psychiatrist for this.  I informed her that we do not routinely prescribe sleeping meds in the ER.  We discussed return precautions and she voiced understanding  Risk Prescription drug management.   Final Clinical Impression(s) / ED  Diagnoses Final diagnoses:  Insomnia, unspecified type  Anxiety    Rx / DC Orders ED Discharge Orders     None         Jovie Swanner, [place], PA 04/11/23 1645    [place], [place], DO 04/12/23 [phone]
